# Patient Record
Sex: Male | Born: 1990 | Race: Black or African American | Hispanic: No | Marital: Single | Smoking: Current every day smoker
Health system: Southern US, Community
[De-identification: ages and names within clinical notes are randomized; demographics above are authoritative.]

## PROBLEM LIST (undated history)

## (undated) DIAGNOSIS — F319 Bipolar disorder, unspecified: Secondary | ICD-10-CM

## (undated) DIAGNOSIS — F909 Attention-deficit hyperactivity disorder, unspecified type: Secondary | ICD-10-CM

## (undated) HISTORY — PX: BACK SURGERY: SHX140

---
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
## 2010-09-27 ENCOUNTER — Emergency Department (HOSPITAL_COMMUNITY)
Admission: EM | Admit: 2010-09-27 | Discharge: 2010-09-27 | Disposition: A | Discharge status: Home / Self Care | Payer: Medicaid Other | Attending: Emergency Medicine | Admitting: Emergency Medicine

## 2010-09-27 ENCOUNTER — Emergency Department (HOSPITAL_COMMUNITY): Payer: Medicaid Other

## 2010-09-27 DIAGNOSIS — F988 Other specified behavioral and emotional disorders with onset usually occurring in childhood and adolescence: Secondary | ICD-10-CM | POA: Insufficient documentation

## 2010-09-27 DIAGNOSIS — J40 Bronchitis, not specified as acute or chronic: Secondary | ICD-10-CM | POA: Insufficient documentation

## 2011-04-08 ENCOUNTER — Emergency Department (INDEPENDENT_AMBULATORY_CARE_PROVIDER_SITE_OTHER)
Admission: EM | Admit: 2011-04-08 | Discharge: 2011-04-08 | Disposition: A | Discharge status: Home / Self Care | Payer: Medicaid Other | Source: Home / Self Care | Attending: Emergency Medicine | Admitting: Emergency Medicine

## 2011-04-08 ENCOUNTER — Encounter (HOSPITAL_COMMUNITY): Payer: Self-pay

## 2011-04-08 DIAGNOSIS — N342 Other urethritis: Secondary | ICD-10-CM

## 2011-04-08 LAB — HIV ANTIBODY (ROUTINE TESTING W REFLEX): HIV: NONREACTIVE

## 2011-04-08 MED ORDER — LIDOCAINE HCL (PF) 1 % IJ SOLN
INTRAMUSCULAR | Status: AC
  Filled 2011-04-08: qty 5

## 2011-04-08 MED ORDER — CEFTRIAXONE SODIUM 250 MG IJ SOLR
INTRAMUSCULAR | Status: AC
  Filled 2011-04-08: qty 250

## 2011-04-08 MED ORDER — CEFTRIAXONE SODIUM 250 MG IJ SOLR
250.0000 mg | Freq: Once | INTRAMUSCULAR | Status: AC
  Administered 2011-04-08: 250 mg via INTRAMUSCULAR

## 2011-04-08 MED ORDER — AZITHROMYCIN 250 MG PO TABS
ORAL_TABLET | ORAL | Status: AC
  Filled 2011-04-08: qty 4

## 2011-04-08 MED ORDER — AZITHROMYCIN 250 MG PO TABS
1000.0000 mg | ORAL_TABLET | Freq: Once | ORAL | Status: AC
  Administered 2011-04-08: 1000 mg via ORAL

## 2011-04-08 NOTE — ED Notes (Signed)
Patient complains of penile discharge x 3 days;  Patient also complains of burning with urination.

## 2011-04-08 NOTE — ED Provider Notes (Signed)
History     CSN: [id number] Arrival date & time: 04/08/2011  3:47 PM   First MD Initiated Contact with Patient 04/08/11 1609      Chief Complaint  Patient presents with  . Penile Discharge    (Consider location/radiation/quality/duration/timing/severity/associated sxs/prior treatment) HPI Comments: Fernando Mccormick has had a two-day history of a whitish urethral discharge and slight dysuria. There is tenderness at the end of his penis. He denies any fever, chills, skin rash, adenopathy, abdominal pain, nausea, or vomiting. He has had no genital lesions. He has 2 separate sexual partners. His last sexual intercourse was about 5 days ago. He denies any prior history of sexually transmitted diseases.  Patient is a 20 y.o. male presenting with penile discharge.  Penile Discharge Pertinent negatives include no abdominal pain.    History reviewed. No pertinent past medical history.  Past Surgical History  Procedure Date  . Orthopedic surgery 2008    left wrist fracture with internal fixation    History reviewed. No pertinent family history.  History  Substance Use Topics  . Smoking status: Current Everyday Smoker -- 0.2 packs/day  . Smokeless tobacco: Not on file  . Alcohol Use: No      Review of Systems  Constitutional: Negative for fever and chills.  Gastrointestinal: Negative for nausea, vomiting and abdominal pain.  Genitourinary: Positive for dysuria, discharge and penile pain. Negative for urgency, frequency, hematuria, flank pain, penile swelling, scrotal swelling, genital sores and testicular pain.    Allergies  Review of patient's allergies indicates not on file.  Home Medications  No current outpatient prescriptions on file.  BP 125/67  Pulse 82  Temp(Src) 98.3 F (36.8 C) (Oral)  Resp 16  SpO2 100%  Physical Exam  Nursing note and vitals reviewed. Constitutional: He appears well-developed and well-nourished. No distress.  Cardiovascular: Normal rate and regular  rhythm.  Exam reveals no gallop and no friction rub.   No murmur heard. Pulmonary/Chest: Effort normal. No respiratory distress. He has no wheezes. He has no rales.  Abdominal: Soft. Bowel sounds are normal. He exhibits no distension and no mass. There is no hepatosplenomegaly. There is no tenderness. There is no rebound, no guarding and no CVA tenderness.  Genitourinary: Penile tenderness present.       There is a whitish yellow urethral discharge and some pain to palpation at the end of the penis. There were no lesions on the penis. Testes were not tender and there were no masses. No inguinal lymphadenopathy. No ulcerations, blisters, or sores.  Skin: He is not diaphoretic.    ED Course  Procedures (including critical care time)   Labs Reviewed  RPR  GC/CHLAMYDIA PROBE AMP, GENITAL  HIV ANTIBODY (ROUTINE TESTING)   No results found.   1. 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 He was treated with Rocephin and azithromycin. Suggested he inform his partners so they can get tested and treated as well.        Roque Lias, MD 04/08/11 [phone]

## 2011-04-09 LAB — RPR: RPR Ser Ql: NONREACTIVE

## 2011-04-09 LAB — GC/CHLAMYDIA PROBE AMP, GENITAL
Chlamydia, DNA Probe: NEGATIVE
GC Probe Amp, Genital: POSITIVE — AB

## 2011-04-10 ENCOUNTER — Telehealth (HOSPITAL_COMMUNITY): Payer: Self-pay | Admitting: *Deleted

## 2011-04-10 NOTE — ED Notes (Addendum)
Labs and chart reviewed. Pt. pos. for GC and adequately treated with Rocephin and Zithromax.  DHHS form completed and faxed to [facility]. None of pt.'s numbers in service.  Will send a letter. Cherly Anderson M

## 2011-04-12 NOTE — ED Notes (Addendum)
Letter sent. Fernando Mccormick 04/12/2011

## 2012-09-06 ENCOUNTER — Encounter (HOSPITAL_COMMUNITY): Payer: Self-pay | Admitting: Emergency Medicine

## 2012-09-06 ENCOUNTER — Emergency Department (INDEPENDENT_AMBULATORY_CARE_PROVIDER_SITE_OTHER)
Admission: EM | Admit: 2012-09-06 | Discharge: 2012-09-06 | Disposition: A | Discharge status: Home / Self Care | Payer: Self-pay | Source: Home / Self Care | Attending: Emergency Medicine | Admitting: Emergency Medicine

## 2012-09-06 ENCOUNTER — Other Ambulatory Visit (HOSPITAL_COMMUNITY)
Admission: RE | Admit: 2012-09-06 | Discharge: 2012-09-06 | Disposition: A | Discharge status: Home / Self Care | Payer: Self-pay | Source: Ambulatory Visit | Attending: Emergency Medicine | Admitting: Emergency Medicine

## 2012-09-06 DIAGNOSIS — A64 Unspecified sexually transmitted disease: Secondary | ICD-10-CM

## 2012-09-06 DIAGNOSIS — Z113 Encounter for screening for infections with a predominantly sexual mode of transmission: Secondary | ICD-10-CM | POA: Insufficient documentation

## 2012-09-06 MED ORDER — AZITHROMYCIN 250 MG PO TABS
ORAL_TABLET | ORAL | Status: AC
  Filled 2012-09-06: qty 4

## 2012-09-06 MED ORDER — CEFTRIAXONE SODIUM 250 MG IJ SOLR
250.0000 mg | Freq: Once | INTRAMUSCULAR | Status: AC
  Administered 2012-09-06: 250 mg via INTRAMUSCULAR

## 2012-09-06 MED ORDER — AZITHROMYCIN 250 MG PO TABS
1000.0000 mg | ORAL_TABLET | Freq: Once | ORAL | Status: AC
  Administered 2012-09-06: 1000 mg via ORAL

## 2012-09-06 MED ORDER — CEFTRIAXONE SODIUM 1 G IJ SOLR
INTRAMUSCULAR | Status: AC
  Filled 2012-09-06: qty 10

## 2012-09-06 MED ORDER — LIDOCAINE HCL (PF) 1 % IJ SOLN
INTRAMUSCULAR | Status: AC
  Filled 2012-09-06: qty 5

## 2012-09-06 NOTE — ED Notes (Signed)
C/o discharge from penis with irritation and dysuria.   Symptoms present x 3 days. Denies pelvic/abdominal pain.

## 2012-09-06 NOTE — ED Notes (Signed)
Will discharge at 2:20

## 2012-09-06 NOTE — ED Provider Notes (Signed)
History     CSN: [id number]  Arrival date & time 09/06/12  1219   First MD Initiated Contact with Patient 09/06/12 1316      Chief Complaint  Patient presents with  . Exposure to STD    discharge from penis and irritation    (Consider location/radiation/quality/duration/timing/severity/associated sxs/prior treatment) HPI Comments: Patient presents urgent care describing for the last 3 days have been having a color discharge from his penis with irritation when he urinates. Patient admits to unsafe sexual practices haven't had a last unprotected encounter about a week ago. Patient denies any rashes, rectal pain or abdominal pain or fevers. Describes that he has been tested for HIV and syphilis in the past with negative results.  Patient is a 22 y.o. male presenting with STD exposure. The history is provided by the patient.  Exposure to STD This is a new problem. The problem occurs constantly. The problem has not changed since onset.Pertinent negatives include no abdominal pain. Nothing aggravates the symptoms.    History reviewed. No pertinent past medical history.  Past Surgical History  Procedure Laterality Date  . Orthopedic surgery  2008    left wrist fracture with internal fixation    History reviewed. No pertinent family history.  History  Substance Use Topics  . Smoking status: Current Every Day Smoker -- 0.25 packs/day  . Smokeless tobacco: Not on file  . Alcohol Use: No      Review of Systems  Constitutional: Negative for fever, activity change and appetite change.  Gastrointestinal: Negative for nausea, vomiting and abdominal pain.  Genitourinary: Positive for discharge and penile pain. Negative for urgency, frequency, flank pain, decreased urine volume and genital sores.  Skin: Negative for rash.    Allergies  Review of patient's allergies indicates no known allergies.  Home Medications  No current outpatient prescriptions on file.  BP 114/72  Pulse 59   Temp(Src) 98 F (36.7 C) (Oral)  Resp 16  SpO2 97%  Physical Exam  Constitutional: He appears well-developed and well-nourished.  Genitourinary: Penis normal. No penile tenderness.  Neurological: He is alert.  Skin: No rash noted. No erythema.    ED Course  Procedures (including critical care time)  Labs Reviewed  URINE CYTOLOGY ANCILLARY ONLY   No results found.   1. Sexually transmitted disease       MDM  Urine cytology study ordered for STD screening. Patient has been treated today with both sagittal and azithromycin. Have been instructed to followup at the STD clinic at [facility] 4-6 weeks from now. For further HIV and syphilis screening.        Jimmie Molly, MD 09/06/12 [phone]

## 2012-09-08 ENCOUNTER — Telehealth (HOSPITAL_COMMUNITY): Payer: Self-pay | Admitting: *Deleted

## 2012-09-08 NOTE — ED Notes (Signed)
GC neg., Chlamydia pos., Trich neg.  Pt. adequately treated with Zithromax.  I called and person that answered home number said the number was incorrect.  I verified that I had dialed correctly. I called mobile number and left a message to call. [name] 09/08/2012

## 2012-09-10 ENCOUNTER — Telehealth (HOSPITAL_COMMUNITY): Payer: Self-pay | Admitting: *Deleted

## 2012-09-10 NOTE — ED Notes (Signed)
I called pt. and pt.'s mother answered the phone. She said he is at home but does not have a phone.  She asked if there was anything wrong. I told her I could not tell her. She said she would give him the message to call me. [name] 09/10/2012

## 2012-09-11 ENCOUNTER — Telehealth (HOSPITAL_COMMUNITY): Payer: Self-pay | Admitting: *Deleted

## 2012-09-15 ENCOUNTER — Telehealth (HOSPITAL_COMMUNITY): Payer: Self-pay | Admitting: *Deleted

## 2012-09-15 NOTE — ED Notes (Signed)
4/18 Left message.  Call 3.  4/22  Confidential letter sent with results and instructions. [name] 09/15/2012

## 2013-01-28 ENCOUNTER — Emergency Department (HOSPITAL_COMMUNITY)
Admission: EM | Admit: 2013-01-28 | Discharge: 2013-01-28 | Disposition: A | Discharge status: Home / Self Care | Payer: Medicaid Other | Attending: Emergency Medicine | Admitting: Emergency Medicine

## 2013-01-28 ENCOUNTER — Encounter (HOSPITAL_COMMUNITY): Payer: Self-pay | Admitting: Emergency Medicine

## 2013-01-28 DIAGNOSIS — R059 Cough, unspecified: Secondary | ICD-10-CM | POA: Insufficient documentation

## 2013-01-28 DIAGNOSIS — F17200 Nicotine dependence, unspecified, uncomplicated: Secondary | ICD-10-CM | POA: Insufficient documentation

## 2013-01-28 DIAGNOSIS — R05 Cough: Secondary | ICD-10-CM | POA: Insufficient documentation

## 2013-01-28 DIAGNOSIS — R079 Chest pain, unspecified: Secondary | ICD-10-CM | POA: Insufficient documentation

## 2013-01-28 DIAGNOSIS — J069 Acute upper respiratory infection, unspecified: Secondary | ICD-10-CM | POA: Insufficient documentation

## 2013-01-28 DIAGNOSIS — J3489 Other specified disorders of nose and nasal sinuses: Secondary | ICD-10-CM | POA: Insufficient documentation

## 2013-01-28 DIAGNOSIS — Z8659 Personal history of other mental and behavioral disorders: Secondary | ICD-10-CM | POA: Insufficient documentation

## 2013-01-28 HISTORY — DX: Attention-deficit hyperactivity disorder, unspecified type: F90.9

## 2013-01-28 NOTE — ED Notes (Signed)
Patient complaining of sore throat and cough since last night.

## 2013-01-28 NOTE — ED Provider Notes (Signed)
CSN: [id number]     Arrival date & time 01/28/13  2319 History  This chart was scribed for Zadie Rhine, MD by Bennett Scrape, ED Scribe. This patient was seen in room APA18/APA18 and the patient's care was started at 11:30 PM.   Chief Complaint  Patient presents with  . Sore Throat  . Cough    Patient is a 22 y.o. male presenting with cough. The history is provided by the patient. No language interpreter was used.  Cough Cough characteristics:  Productive Sputum characteristics:  Green Onset quality:  Gradual Duration:  1 day Timing:  Constant Progression:  Worsening Chronicity:  New Smoker: yes   Associated symptoms: chest pain and sore throat   Associated symptoms: no fever and no shortness of breath     HPI Comments: Fernando Mccormick is a 22 y.o. male who presents to the Emergency Department complaining of cough intermittently productive of green sputum with associated sore throat, nasal congestion and chest pain that began last night. The CP described as soreness and sore throat are aggravated by coughing. He reports taking ibuprofen with no improvement. He denies having a h/o of asthma or any other pulmonary problems. He denies fevers, chills and nausea as associated symptoms.   Past Medical History  Diagnosis Date  . ADHD (attention deficit hyperactivity disorder)    Past Surgical History  Procedure Laterality Date  . Orthopedic surgery  2008    left wrist fracture with internal fixation   History reviewed. No pertinent family history. History  Substance Use Topics  . Smoking status: Current Every Day Smoker -- 0.25 packs/day  . Smokeless tobacco: Not on file  . Alcohol Use: Yes     Comment: occasionally    Review of Systems  Constitutional: Negative for fever.  HENT: Positive for congestion and sore throat. Negative for trouble swallowing.   Respiratory: Positive for cough. Negative for shortness of breath.   Cardiovascular: Positive for chest pain.  All other  systems reviewed and are negative.    Allergies  Shellfish allergy  Home Medications  No current outpatient prescriptions on file.  Triage Vitals: BP 116/75  Pulse 66  Temp(Src) 98.1 F (36.7 C) (Oral)  Resp 16  Ht 6' (1.829 m)  Wt 145 lb (65.772 kg)  BMI 19.66 kg/m2  SpO2 96%  Physical Exam  Nursing note and vitals reviewed.  CONSTITUTIONAL: Well developed/well nourished HEAD: Normocephalic/atraumatic EYES: EOMI/PERRL ENMT: Mucous membranes moist, nasal congestion NECK: supple no meningeal signs SPINE:entire spine nontender CV: S1/S2 noted, no murmurs/rubs/gallops noted LUNGS: Lungs are clear to auscultation bilaterally, no apparent distress ABDOMEN: soft, nontender, no rebound or guarding GU:no cva tenderness NEURO: Pt is awake/alert, moves all extremitiesx4 EXTREMITIES: pulses normal, full ROM SKIN: warm, color normal PSYCH: no abnormalities of mood noted  ED Course  Procedures (including critical care time)  DIAGNOSTIC STUDIES: Oxygen Saturation is 96% on room air, normal by my interpretation.    COORDINATION OF CARE: 11:38 PM-Informed pt that his symptoms are most likely viral and that no further work up is needed. Discussed discharge plan which includes ibuprofen for pain with pt and pt agreed to plan. Also advised pt to follow up as needed and pt agreed. Addressed symptoms to return for with pt.   Labs Review Labs Reviewed - No data to display Imaging Review No results found.  MDM  No diagnosis found. Nursing notes including past medical history and social history reviewed and considered in documentation   I personally performed the services  described in this documentation, which was scribed in my presence. The recorded information has been reviewed and is accurate.      Joya Gaskins, MD 01/29/13 [phone]

## 2014-08-13 ENCOUNTER — Emergency Department (HOSPITAL_COMMUNITY)
Admission: EM | Admit: 2014-08-13 | Discharge: 2014-08-13 | Disposition: A | Discharge status: Home / Self Care | Payer: Medicaid Other | Attending: Emergency Medicine | Admitting: Emergency Medicine

## 2014-08-13 ENCOUNTER — Encounter (HOSPITAL_COMMUNITY): Payer: Self-pay | Admitting: Emergency Medicine

## 2014-08-13 DIAGNOSIS — L089 Local infection of the skin and subcutaneous tissue, unspecified: Secondary | ICD-10-CM | POA: Insufficient documentation

## 2014-08-13 DIAGNOSIS — Y9289 Other specified places as the place of occurrence of the external cause: Secondary | ICD-10-CM | POA: Insufficient documentation

## 2014-08-13 DIAGNOSIS — Y998 Other external cause status: Secondary | ICD-10-CM | POA: Insufficient documentation

## 2014-08-13 DIAGNOSIS — S0086XA Insect bite (nonvenomous) of other part of head, initial encounter: Secondary | ICD-10-CM | POA: Insufficient documentation

## 2014-08-13 DIAGNOSIS — W57XXXA Bitten or stung by nonvenomous insect and other nonvenomous arthropods, initial encounter: Secondary | ICD-10-CM | POA: Insufficient documentation

## 2014-08-13 DIAGNOSIS — Y9389 Activity, other specified: Secondary | ICD-10-CM | POA: Insufficient documentation

## 2014-08-13 DIAGNOSIS — Z8659 Personal history of other mental and behavioral disorders: Secondary | ICD-10-CM | POA: Insufficient documentation

## 2014-08-13 MED ORDER — ACETAMINOPHEN 325 MG PO TABS
650.0000 mg | ORAL_TABLET | Freq: Once | ORAL | Status: AC
  Administered 2014-08-13: 650 mg via ORAL
  Filled 2014-08-13: qty 2

## 2014-08-13 MED ORDER — SULFAMETHOXAZOLE-TRIMETHOPRIM 800-160 MG PO TABS: 1.0000 | ORAL_TABLET | Freq: Two times a day (BID) | ORAL | Status: DC

## 2014-08-13 MED ORDER — ACETAMINOPHEN ER 650 MG PO TBCR: 650.0000 mg | EXTENDED_RELEASE_TABLET | Freq: Three times a day (TID) | ORAL | Status: DC | PRN

## 2014-08-13 MED ORDER — SULFAMETHOXAZOLE-TRIMETHOPRIM 800-160 MG PO TABS
1.0000 | ORAL_TABLET | Freq: Once | ORAL | Status: AC
  Administered 2014-08-13: 1 via ORAL
  Filled 2014-08-13: qty 1

## 2014-08-13 NOTE — ED Notes (Signed)
Pt transported from home with c/o spider bite to forehead x 1 day, A & O, no distress noted.

## 2014-08-13 NOTE — ED Provider Notes (Signed)
CSN: [id number]     Arrival date & time 08/13/14  2051 History   First MD Initiated Contact with Patient 08/13/14 2101     This chart was scribed for non-physician practitioner, Emilia Beck PA-C working with Richardean Canal, MD by Arlan Organ, ED Scribe. This patient was seen in room WTR9/WTR9 and the patient's care was started at 10:03 PM.   Chief Complaint  Patient presents with  . Insect Bite   The history is provided by the patient. No language interpreter was used.    HPI Comments: Fernando Mccormick is a 24 y.o. male with a PMHx of ADHD who presents to the Emergency Department complaining of a possible insect bite to the forehead x 1 day. Pt states he was bitten by a spider and has now noted swelling to  the forehead after "popping" the area. He has not tried any OTC medications or home remedies to help manage symptoms. No recent fever or chills.  No noticeable reactions to other parts of body. No known allergies to medications.  Past Medical History  Diagnosis Date  . ADHD (attention deficit hyperactivity disorder)    Past Surgical History  Procedure Laterality Date  . Orthopedic surgery  2008    left wrist fracture with internal fixation   No family history on file. History  Substance Use Topics  . Smoking status: Current Every Day Smoker -- 0.25 packs/day  . Smokeless tobacco: Not on file  . Alcohol Use: Yes     Comment: occasionally    Review of Systems  Constitutional: Negative for fever and chills.  Skin: Positive for wound.  All other systems reviewed and are negative.     Allergies  Shellfish allergy  Home Medications   Prior to Admission medications   Not on File   Triage Vitals: BP 129/64 mmHg  Pulse 87  Temp(Src) 98.4 F (36.9 C) (Oral)  Resp 16  SpO2 98%   Physical Exam  Constitutional: He is oriented to person, place, and time. He appears well-developed and well-nourished. No distress.  HENT:  Head: Normocephalic and atraumatic.  Eyes:  Conjunctivae and EOM are normal.  Neck: Normal range of motion.  Cardiovascular: Normal rate and regular rhythm.  Exam reveals no gallop and no friction rub.   No murmur heard. Pulmonary/Chest: Effort normal and breath sounds normal. He has no wheezes. He has no rales. He exhibits no tenderness.  Abdominal: Soft. He exhibits no distension. There is no tenderness.  Musculoskeletal: Normal range of motion.  Neurological: He is alert and oriented to person, place, and time.  Speech is goal-oriented. Moves limbs without ataxia.   Skin: Skin is warm and dry.  4cm linear area of erythema and edema of central forehead. No open wound.   Psychiatric: He has a normal mood and affect.  Nursing note and vitals reviewed.   ED Course  Procedures (including critical care time)  DIAGNOSTIC STUDIES: Oxygen Saturation is 98% on R, Normal by my interpretation.    COORDINATION OF CARE: 10:04 PM- Will send home with antibiotic. Discussed treatment plan with pt at bedside and pt agreed to plan.     Labs Review Labs Reviewed - No data to display  Imaging Review No results found.   EKG Interpretation None      MDM   Final diagnoses:  Skin infection    10:08 PM Patient appears to have a small linear area of cellulitis of the central forehead. Vitals stable and patient afebrile. Patient instructed to  return with worsening or concerning symptoms.   I personally performed the services described in this documentation, which was scribed in my presence. The recorded information has been reviewed and is accurate.   Emilia BeckKaitlyn Breslin Burklow, PA-C 08/13/14 2209  Richardean Canalavid H Yao, MD 08/13/14 [phone]

## 2014-08-13 NOTE — Discharge Instructions (Signed)
Take bactrim as needed until gone. Take tylenol as needed for pain. Return to the ED with worsening or concerning symptoms.

## 2014-10-28 ENCOUNTER — Emergency Department (HOSPITAL_COMMUNITY)
Admission: EM | Admit: 2014-10-28 | Discharge: 2014-10-28 | Disposition: A | Discharge status: Home / Self Care | Payer: Medicaid Other | Attending: Emergency Medicine | Admitting: Emergency Medicine

## 2014-10-28 ENCOUNTER — Encounter (HOSPITAL_COMMUNITY): Payer: Self-pay | Admitting: *Deleted

## 2014-10-28 DIAGNOSIS — Z792 Long term (current) use of antibiotics: Secondary | ICD-10-CM | POA: Insufficient documentation

## 2014-10-28 DIAGNOSIS — Z8659 Personal history of other mental and behavioral disorders: Secondary | ICD-10-CM | POA: Insufficient documentation

## 2014-10-28 DIAGNOSIS — A64 Unspecified sexually transmitted disease: Secondary | ICD-10-CM

## 2014-10-28 DIAGNOSIS — Z720 Tobacco use: Secondary | ICD-10-CM | POA: Insufficient documentation

## 2014-10-28 MED ORDER — LIDOCAINE HCL (PF) 1 % IJ SOLN
INTRAMUSCULAR | Status: AC
  Administered 2014-10-28: 5 mL
  Filled 2014-10-28: qty 5

## 2014-10-28 MED ORDER — AZITHROMYCIN 250 MG PO TABS
1000.0000 mg | ORAL_TABLET | Freq: Once | ORAL | Status: AC
  Administered 2014-10-28: 1000 mg via ORAL
  Filled 2014-10-28: qty 4

## 2014-10-28 MED ORDER — CEFTRIAXONE SODIUM 250 MG IJ SOLR
250.0000 mg | Freq: Once | INTRAMUSCULAR | Status: AC
  Administered 2014-10-28: 250 mg via INTRAMUSCULAR
  Filled 2014-10-28: qty 250

## 2014-10-28 NOTE — ED Notes (Signed)
The pt is c/o penis discharge for 2-3 days.  He has had sex with someone that has a std   He thinks.  He also foound a tick on his testicles that he wants looked at

## 2014-10-28 NOTE — ED Provider Notes (Signed)
CSN: [id number]     Arrival date & time 10/28/14  1508 History  This chart was scribed for non-physician practitioner, Catha GosselinHanna Patel-Mills, PA-C working with Toy CookeyMegan Docherty, MD by Doreatha MartinEva Mathews, ED scribe. This patient was seen in room TR06C/TR06C and the patient's care was started at 3:58 PM    Chief Complaint  Patient presents with  . Exposure to STD   The history is provided by the patient. No language interpreter was used.    HPI Comments: Fernando Mccormick is a 24 y.o. male who presents to the Emergency Department complaining of STD exposure due to unprotected sex 3 days ago. He is requesting STD check as well as, HIV and syphilis test. Pt states 6 sexual partners in the past 6 months. He reports white penile discharge onset 3 days ago, and "tickling" with urination as associated symptoms. He also notes one mild incident of hematuria. He states he has had Gonorrhea in the past. No OTC medications tried PTA.  He denies dysuria, abdominal pain, testicular pain, fever or chills.    Past Medical History  Diagnosis Date  . ADHD (attention deficit hyperactivity disorder)    Past Surgical History  Procedure Laterality Date  . Orthopedic surgery  2008    left wrist fracture with internal fixation   No family history on file. History  Substance Use Topics  . Smoking status: Current Every Day Smoker -- 0.25 packs/day  . Smokeless tobacco: Not on file  . Alcohol Use: Yes     Comment: occasionally    Review of Systems  Constitutional: Negative for fever and chills.  Gastrointestinal: Negative for abdominal pain.  Genitourinary: Positive for hematuria ("one drop of blood") and discharge. Negative for flank pain, penile swelling, scrotal swelling, difficulty urinating, genital sores, penile pain and testicular pain.  Skin: Negative for rash and wound.      Allergies  Shellfish allergy  Home Medications   Prior to Admission medications   Medication Sig Start Date End Date Taking? Authorizing  Provider  acetaminophen (TYLENOL 8 HOUR) 650 MG CR tablet Take 1 tablet (650 mg total) by mouth every 8 (eight) hours as needed for pain. 08/13/14   Kaitlyn Szekalski, PA-C  sulfamethoxazole-trimethoprim (SEPTRA DS) 800-160 MG per tablet Take 1 tablet by mouth every 12 (twelve) hours. 08/13/14   Emilia BeckKaitlyn Szekalski, PA-C   Triage VS: BP 125/73 mmHg  Pulse 84  Temp(Src) 98.3 F (36.8 C) (Oral)  Resp 20  Wt 154 lb 9.6 oz (70.126 kg)  SpO2 98% Physical Exam  Constitutional: He is oriented to person, place, and time. He appears well-developed and well-nourished. No distress.  HENT:  Head: Normocephalic and atraumatic.  Mouth/Throat: Oropharynx is clear and moist.  Eyes: Conjunctivae and EOM are normal. Pupils are equal, round, and reactive to light.  Neck: Normal range of motion. Neck supple. No tracheal deviation present.  Cardiovascular: Normal rate and normal heart sounds.  Exam reveals no gallop and no friction rub.   No murmur heard. Pulmonary/Chest: Breath sounds normal. No respiratory distress.  Abdominal: Soft. He exhibits no distension. There is no tenderness. There is no rebound and no guarding.  Musculoskeletal: Normal range of motion. He exhibits no edema or tenderness.  Neurological: He is alert and oriented to person, place, and time.  Skin: Skin is warm and dry.  Psychiatric: He has a normal mood and affect. His behavior is normal.  Nursing note and vitals reviewed.   ED Course  Procedures (including critical care time) DIAGNOSTIC STUDIES: Oxygen  Saturation is 98% on RA, normal by my interpretation.    COORDINATION OF CARE: 4:03 PM Discussed treatment plan with pt at bedside and pt agreed to plan.   Labs Review Labs Reviewed  RPR  HIV ANTIBODY (ROUTINE TESTING)    Imaging Review No results found.   EKG Interpretation None      MDM   Final diagnoses:  STD (male)   Patient presents for white discharge from the penis for the past 3 days with STD exposure.  He denies having a tick on his testicle at this time and states he removed it.  Medications  azithromycin (ZITHROMAX) tablet 1,000 mg (1,000 mg Oral Given 10/28/14 1613)  cefTRIAXone (ROCEPHIN) injection 250 mg (250 mg Intramuscular Given 10/28/14 1613)  lidocaine (PF) (XYLOCAINE) 1 % injection (5 mLs  Given 10/28/14 1613)  I discussed no sexual intercourse for 6 weeks and that the hospital would call him with the results of the HIV and syphilis results. He is to inform his sexual partners.   I personally performed the services described in this documentation, which was scribed in my presence. The recorded information has been reviewed and is accurate.   Catha Gosselin, PA-C 10/29/14 1204  Toy Cookey, MD 10/31/14 [phone]

## 2014-10-28 NOTE — Discharge Instructions (Signed)
Sexually Transmitted Disease °A sexually transmitted disease (STD) is a disease or infection often passed to another person during sex. However, STDs can be passed through nonsexual ways. An STD can be passed through: °· Spit (saliva). °· Semen. °· Blood. °· Mucus from the vagina. °· Pee (urine). °HOW CAN I LESSEN MY CHANCES OF GETTING AN STD? °· Use: °¨ Latex condoms. °¨ Water-soluble lubricants with condoms. Do not use petroleum jelly or oils. °¨ Dental dams. These are small pieces of latex that are used as a barrier during oral sex. °· Avoid having more than one sex partner. °· Do not have sex with someone who has other sex partners. °· Do not have sex with anyone you do not know or who is at high risk for an STD. °· Avoid risky sex that can break your skin. °· Do not have sex if you have open sores on your mouth or skin. °· Avoid drinking too much alcohol or taking illegal drugs. Alcohol and drugs can affect your good judgment. °· Avoid oral and anal sex acts. °· Get shots (vaccines) for HPV and hepatitis. °· If you are at risk of being infected with HIV, it is advised that you take a certain medicine daily to prevent HIV infection. This is called pre-exposure prophylaxis (PrEP). You may be at risk if: °¨ You are a man who has sex with other men (MSM). °¨ You are attracted to the opposite sex (heterosexual) and are having sex with more than one partner. °¨ You take drugs with a needle. °¨ You have sex with someone who has HIV. °· Talk with your doctor about if you are at high risk of being infected with HIV. If you begin to take PrEP, get tested for HIV first. Get tested every 3 months for as long as you are taking PrEP. °WHAT SHOULD I DO IF I THINK I HAVE AN STD? °· See your doctor. °· Tell your sex partner(s) that you have an STD. They should be tested and treated. °· Do not have sex until your doctor says it is okay. °WHEN SHOULD I GET HELP? °Get help right away if: °· You have bad belly (abdominal)  pain. °· You are a man and have puffiness (swelling) or pain in your testicles. °· You are a woman and have puffiness in your vagina. °Document Released: 06/20/2004 Document Revised: 05/18/2013 Document Reviewed: 11/06/2012 °ExitCare® Patient Information ©2015 [employer]. This information is not intended to replace advice given to you by your health care provider. Make sure you discuss any questions you have with your health care provider. ° ° °Emergency Department Resource Guide °1) Find a Doctor and Pay Out of Pocket °Although you won't have to find out who is covered by your insurance plan, it is a good idea to ask around and get recommendations. You will then need to call the office and see if the doctor you have chosen will accept you as a new patient and what types of options they offer for patients who are self-pay. Some doctors offer discounts or will set up payment plans for their patients who do not have insurance, but you will need to ask so you aren't surprised when you get to your appointment. ° °2) Contact Your Local Health Department °Not all health departments have doctors that can see patients for sick visits, but many do, so it is worth a call to see if yours does. If you don't know where your local health department is, you can   check in your phone book. The CDC also has a tool to help you locate your state's health department, and many state websites also have listings of all of their local health departments. ° °3) Find a Walk-in Clinic °If your illness is not likely to be very severe or complicated, you may want to try a walk in clinic. These are popping up all over the country in pharmacies, drugstores, and shopping centers. They're usually staffed by nurse practitioners or physician assistants that have been trained to treat common illnesses and complaints. They're usually fairly quick and inexpensive. However, if you have serious medical issues or chronic medical problems, these are  probably not your best option. ° °No Primary Care Doctor: °- Call [facility] at  832-8000 - they can help you locate a primary care doctor that  accepts your insurance, provides certain services, etc. °- Physician Referral Service- 1-800-533-3463 ° °Chronic Pain Problems: °Organization         Address  Phone   Notes  °[facility]  (336) 297-2271 Patients need to be referred by their primary care doctor.  ° °Medication Assistance: °Organization         Address  Phone   Notes  °[place] [facility] [address] °[place], [place] [address] (336) 641-8030 --Must be a resident of [place] °-- Must have NO insurance coverage whatsoever (no Medicaid/ Medicare, etc.) °-- The pt. MUST have a primary care doctor that directs their care regularly and follows them in the community °  °[facility]  (866) 331-1348   °[employer]  (888) 892-1162   ° °Agencies that provide inexpensive medical care: °Organization         Address  Phone   Notes  °[facility]  (336) 832-8035   °[facility]    (336) 832-7272   °[facility] [address] °[place], [place] [address] (336) 832-4777   °[facility] [address], °[place] (336) 271-4999   °[facility]    (336) 373-0678   °[facility]    (336) 272-1050   °[facility] ° [address], [place] Phone:  (336) 832-4444, Fax:  (336) 832-4440 Hours of Operation:  9 am - 6 pm, M-F.  Also accepts Medicaid/Medicare and self-pay.  °[facility] ° [address], [place] Phone: (336) 832-3150, Fax: (336) [phone]. Hours of Operation:  8:30 am - 5:30 pm, M-F.  Also accepts Medicaid and self-pay.  °[facility] [place] [address], [place] Phone: (336) 878-6027   °[facility] [address], [place], [place] (336)723-1848, Ext. 123 Mondays & Thursdays:  7-9 AM.  First 15 patients are seen on a first come, first serve basis. °  ° °Medicaid-accepting [place] Providers: ° °Organization         Address  Phone   Notes  °[facility] [address], [place] (336) 641-2100 Also accepts self-pay patients.  °[facility] [address], [place] ° (336) 856-9996   °[facility] [address], [place] (336) 288-8857   °[facility] [address] [place] [address], [place] (336) 299-7000   °Veita Bland [address], [place]  ° (336) 373-1557 Only accepts [place] Access Medicaid patients after they have their name applied to   their card.  ° °Self-Pay (no insurance) in [place]: ° °Organization         Address  Phone   Notes  °Sickle Cell Patients, [facility] [address], [place] (336) 832-1970   °[facility] [address], [place] (336) 832-4400   °[facility] ° [address], [place] (336) 992-4800   °[facility]/Dr. Osei-Bonsu ° [address] [place] [address], [place] or [address], [place] (336) 841-8500 Phone number for both [place] and [place] locations is the same.  °[facility] [address], [place] (336) 299-0000   °[facility] [address] [place] [address], [place] or [address] (336) 852-7530 °(336) 878-2260   °[facility] [address], [place] (336) 350-1642, phone; (336) 294-5005, fax Sees patients 1st and 3rd Saturday of every month.  Must not qualify for public or private insurance (i.e. Medicaid, Medicare, [place] Health Choice, Veterans' Benefits) • Household income should be no more than 200% of the poverty level •The clinic cannot treat you if you are pregnant or think you are pregnant • Sexually transmitted diseases are not treated at the clinic.   ° ° °Dental Care: °Organization         Address  Phone  Notes  °[facility] [address], [place] (336) 641-6152 Accepts children up to age 21 who are enrolled in Medicaid or [place] Health Choice; pregnant women with a Medicaid card; and children who have applied for Medicaid or [place] Health Choice, but were declined, whose parents can pay a reduced fee at time of service.  °[facility] [place]  [address], [place] (336) 641-7733 Accepts children up to age 21 who are enrolled in Medicaid or [place] Health Choice; pregnant women with a Medicaid card; and children who have applied for Medicaid or [place] Health Choice, but were declined, whose parents can pay a reduced fee at time of service.  °[facility] ° [address], [place] (336) 641-4533 Patients are seen by appointment only. Walk-ins are not accepted. [facility] will see patients 18 years of age and older. °Monday - Tuesday (8am-5pm) °Most Wednesdays (8:30-5pm) °$30 per visit, cash only  °[facility] ° [address], [place] (336) 641-4533 Patients are seen by appointment only. Walk-ins are not accepted. [facility] will see patients 18 years of age and older. °One Wednesday Evening (Monthly: Volunteer Based).  $30 per visit, cash only  °[facility]  (919) 537-3737 for adults; Children under age 4, call Graduate Pediatric Dentistry at ([phone]) [phone]. Children aged 4-14, please call (919) [phone] to request a pediatric application. ° Dental services are provided in all areas of dental care including fillings, crowns and bridges, complete and partial dentures, implants, gum treatment, root canals, and extractions. Preventive care is also provided. Treatment is provided to both adults and children. °Patients are selected via a lottery and there is often a waiting  list. °  °[facility] [address], ° ° (336) 763-8833 [email or web address] °  °[facility] [address], [place], [place] (336)723-1848, Ext. 123 Second and Fourth Thursday of each month, opens at 6:30 AM; Clinic ends at 9 AM.  Patients are seen on a first-come first-served basis,   and a limited number are seen during each clinic.  ° °[facility] ° [address], [place], [place] (336) 723-7904   Eligibility Requirements °You must have lived in [place], [place], or [place] counties for at least the last three months. °  You cannot be eligible for state or federal sponsored healthcare insurance, including Veterans Administration, Medicaid, or Medicare. °  You generally cannot be eligible for healthcare insurance through your employer.  °  How to apply: °Eligibility screenings are held every Tuesday and Wednesday afternoon from 1:00 pm until 4:00 pm. You do not need an appointment for the interview!  °[facility] [address], [place], [place] 336-631-2330   °[facility]  336-342-8273   °[facility]  336-703-3100   °[facility]  336-570-6415   ° °Behavioral Health Resources in the Community: °Intensive Outpatient Programs °Organization         Address  Phone  Notes  °[facility] [address], [place], [place] 336-878-6098   °[facility] [address], [place], [place] 336-832-9800   °[facility] [address], [place], [place] ° 336-882-2125   °[facility] [address],  °[place], [place] 1-800-853-5163 or 336-641-4981   °Substance Abuse Resources °Organization         Address  Phone  Notes  °[facility]  336-882-2125   °[facility]  336-784-9470   °[facility]  336-285-9073   °[facility]  336-845-3988   °Residential & Outpatient Substance Abuse Program   1-800-659-3381   °Psychological Services °Organization         Address  Phone  Notes  °[facility]  336- 832-9600   °[facility]  336- 378-7881   °[facility] [address], [place] 1-800-853-5163 or 336-641-4981   ° °Mobile Crisis Teams °Organization         Address  Phone  Notes  °[facility], Mobile Crisis Care Unit  1-877-626-1772   °Assertive °[facility] ° [address] [place], [place] 336-834-9664   °Sharon DeEsch [address] °[place] 336-554-5454   ° °Self-Help/Support Groups °Organization         Address  Phone             Notes  °[facility] [place] - variety of support groups  336- 373-1402 Call for more information  °Narcotics Anonymous (NA), [facility] [address], °[place]  2 meetings at this location  ° °Residential Treatment Programs °Organization         Address  Phone  Notes  °[facility] [address],    °[place]  1-866-801-8205   °[facility] ° [address], [place], [place] 704-293-8524   °[facility] [address], [place] 336-845-3988 Admissions: 8am-3pm M-F  °[facility] [address]    °[place], [place] 336-841-1104   °[facility] [address], [place], [place] 336-379-7146   °[facility] [address]  °[place], [place] 336-285-9073   °[facility] Intensive Outpatient [address], [place], [place] 336-852-3033   °[facility] [address]  °[place], [place] 1-877-615-2722 or 336-784-9470   °[facility] [address] [place], [place] 336-227-7417 Accepts Medicaid  °[facility] [address]  °[place] 1-800-659-3381 Substance Abuse/Addiction Treatment  ° °[place] Behavioral Health Resources °Organization          Address  Phone  Notes  °[facility]  (888) 581-9988   °Julie Brannon, PhD [address] [place], [place]   (336) 349-5553 or (336) 951-0000   °[facility]   [address] °[place], [place] (336) 349-4454   °[facility] [address], [place], [place] (336) 342-8316 Insurance/Medicaid/sponsorship through [facility]  °[facility] [address]                                    [place], [place] (336) 342-8316 Therapy/tele-psych/case  °[facility] [address]  ° [place], [place] (336) 349-2233    °Dr. Arfeen  (336) 349-4544   °[facility] [place]  [employer] [place] [facility] 1) [address], [place] °2) [address], [place] °3)  [address], [place] (336) 349-3220 °(336) 342-7768 ° °(336) 342-8140   °[place] [facility] (336) 342-1394 or (336) 342-3537 (After Hours)    ° ° ° °

## 2014-10-29 LAB — RPR: RPR Ser Ql: NONREACTIVE

## 2014-10-29 LAB — HIV ANTIBODY (ROUTINE TESTING W REFLEX): HIV Screen 4th Generation wRfx: NONREACTIVE

## 2014-10-31 ENCOUNTER — Encounter (HOSPITAL_COMMUNITY): Payer: Self-pay | Admitting: *Deleted

## 2014-10-31 ENCOUNTER — Emergency Department (HOSPITAL_COMMUNITY)
Admission: EM | Admit: 2014-10-31 | Discharge: 2014-10-31 | Disposition: A | Discharge status: Home / Self Care | Payer: Medicaid Other | Attending: Emergency Medicine | Admitting: Emergency Medicine

## 2014-10-31 DIAGNOSIS — R197 Diarrhea, unspecified: Secondary | ICD-10-CM | POA: Insufficient documentation

## 2014-10-31 DIAGNOSIS — M791 Myalgia: Secondary | ICD-10-CM | POA: Insufficient documentation

## 2014-10-31 DIAGNOSIS — Z8659 Personal history of other mental and behavioral disorders: Secondary | ICD-10-CM | POA: Insufficient documentation

## 2014-10-31 DIAGNOSIS — H578 Other specified disorders of eye and adnexa: Secondary | ICD-10-CM | POA: Insufficient documentation

## 2014-10-31 DIAGNOSIS — J029 Acute pharyngitis, unspecified: Secondary | ICD-10-CM | POA: Insufficient documentation

## 2014-10-31 DIAGNOSIS — Y999 Unspecified external cause status: Secondary | ICD-10-CM | POA: Insufficient documentation

## 2014-10-31 DIAGNOSIS — R509 Fever, unspecified: Secondary | ICD-10-CM | POA: Insufficient documentation

## 2014-10-31 DIAGNOSIS — R51 Headache: Secondary | ICD-10-CM | POA: Insufficient documentation

## 2014-10-31 DIAGNOSIS — S70261A Insect bite (nonvenomous), right hip, initial encounter: Secondary | ICD-10-CM | POA: Insufficient documentation

## 2014-10-31 DIAGNOSIS — R21 Rash and other nonspecific skin eruption: Secondary | ICD-10-CM | POA: Insufficient documentation

## 2014-10-31 DIAGNOSIS — R05 Cough: Secondary | ICD-10-CM | POA: Insufficient documentation

## 2014-10-31 DIAGNOSIS — Y939 Activity, unspecified: Secondary | ICD-10-CM | POA: Insufficient documentation

## 2014-10-31 DIAGNOSIS — R0981 Nasal congestion: Secondary | ICD-10-CM | POA: Insufficient documentation

## 2014-10-31 DIAGNOSIS — R110 Nausea: Secondary | ICD-10-CM | POA: Insufficient documentation

## 2014-10-31 DIAGNOSIS — Z720 Tobacco use: Secondary | ICD-10-CM | POA: Insufficient documentation

## 2014-10-31 DIAGNOSIS — Y929 Unspecified place or not applicable: Secondary | ICD-10-CM | POA: Insufficient documentation

## 2014-10-31 DIAGNOSIS — W57XXXA Bitten or stung by nonvenomous insect and other nonvenomous arthropods, initial encounter: Secondary | ICD-10-CM | POA: Insufficient documentation

## 2014-10-31 LAB — BASIC METABOLIC PANEL
Anion gap: 6 (ref 5–15)
BUN: 14 mg/dL (ref 6–20)
CO2: 30 mmol/L (ref 22–32)
Calcium: 8.8 mg/dL — ABNORMAL LOW (ref 8.9–10.3)
Chloride: 101 mmol/L (ref 101–111)
Creatinine, Ser: 1.13 mg/dL (ref 0.61–1.24)
GFR calc Af Amer: >60 mL/min (ref >60)
GFR calc non Af Amer: >60 mL/min (ref >60)
Glucose, Bld: 98 mg/dL (ref 65–99)
Potassium: 3.6 mmol/L (ref 3.5–5.1)
Sodium: 137 mmol/L (ref 135–145)

## 2014-10-31 LAB — CBC WITH DIFFERENTIAL/PLATELET
Basophils Absolute: 0 10*3/uL (ref 0.0–0.1)
Basophils Relative: 1 % (ref 0–1)
Eosinophils Absolute: 0.1 10*3/uL (ref 0.0–0.7)
Eosinophils Relative: 2 % (ref 0–5)
HCT: 41.5 % (ref 39.0–52.0)
Hemoglobin: 14.1 g/dL (ref 13.0–17.0)
Lymphocytes Relative: 24 % (ref 12–46)
Lymphs Abs: 1.4 10*3/uL (ref 0.7–4.0)
MCH: 27.6 pg (ref 26.0–34.0)
MCHC: 34 g/dL (ref 30.0–36.0)
MCV: 81.2 fL (ref 78.0–100.0)
Monocytes Absolute: 0.8 10*3/uL (ref 0.1–1.0)
Monocytes Relative: 15 % — ABNORMAL HIGH (ref 3–12)
Neutro Abs: 3.4 10*3/uL (ref 1.7–7.7)
Neutrophils Relative %: 58 % (ref 43–77)
Platelets: 181 10*3/uL (ref 150–400)
RBC: 5.11 MIL/uL (ref 4.22–5.81)
RDW: 13.1 % (ref 11.5–15.5)
WBC: 5.7 10*3/uL (ref 4.0–10.5)

## 2014-10-31 MED ORDER — DOXYCYCLINE HYCLATE 100 MG PO CAPS: 100.0000 mg | ORAL_CAPSULE | Freq: Two times a day (BID) | ORAL | Status: DC

## 2014-10-31 MED ORDER — ONDANSETRON HCL 4 MG PO TABS: 4.0000 mg | ORAL_TABLET | Freq: Four times a day (QID) | ORAL | Status: DC

## 2014-10-31 NOTE — Discharge Instructions (Signed)
Take tylenol and ibuprofen as needed for aching and fever, return as needed for worsening symptoms

## 2014-10-31 NOTE — ED Notes (Signed)
Declined W/C at D/C and was escorted to lobby by RN. 

## 2014-10-31 NOTE — ED Provider Notes (Signed)
CSN: [id number]     Arrival date & time 10/31/14  1207 History  This chart was scribed for Kerrie Buffalo, NP working with Linwood Dibbles, MD by Evon Slack, ED Scribe. This patient was seen in room TR03C/TR03C and the patient's care was started at 1:01 PM.      Chief Complaint  Patient presents with  . Tick Removal    Patient is a 24 y.o. male presenting with animal bite. The history is provided by the patient.  Animal Bite Contact animal:  Insect Animal bite location: right buttock. Time since incident:  7 days Pain details:    Quality:  Itching   Severity:  Mild   Timing:  Constant Notifications:  None Animal in possession: no   Relieved by:  Nothing Ineffective treatments:  OTC medications Associated symptoms: fever and rash    HPI Comments: Fernando Mccormick is a 24 y.o. male who presents to the Emergency Department complaining of tick bite to his right buttock onset 6-7 days prior. Pt states that he started having headache, fever, congestion, body aches, diarrhea and occasional nausea 2 days ago. He also has eye irritation.  Pt states that he has also noticed small rash on his left arm. Pt states that he has tried ibuprofen and OTC medication with no relief. Pt states that he is having trouble sleeping due the chest congestion. Denies ear pain, vomiting or other related symptoms.    Past Medical History  Diagnosis Date  . ADHD (attention deficit hyperactivity disorder)    Past Surgical History  Procedure Laterality Date  . Orthopedic surgery  2008    left wrist fracture with internal fixation   History reviewed. No pertinent family history. History  Substance Use Topics  . Smoking status: Current Every Day Smoker -- 0.25 packs/day  . Smokeless tobacco: Not on file  . Alcohol Use: Yes     Comment: occasionally    Review of Systems  Constitutional: Positive for fever.  HENT: Positive for congestion and sore throat.   Eyes: Positive for redness.  Respiratory: Positive for  cough.   Gastrointestinal: Positive for nausea and diarrhea. Negative for vomiting.  Musculoskeletal: Positive for myalgias.  Skin: Positive for rash.  Neurological: Positive for headaches.  All other systems reviewed and are negative.     Allergies  Shellfish allergy  Home Medications   Prior to Admission medications   Medication Sig Start Date End Date Taking? Authorizing Provider  ibuprofen (ADVIL,MOTRIN) 200 MG tablet Take 600 mg by mouth every 6 (six) hours as needed for headache or mild pain.   Yes Historical Provider, MD  acetaminophen (TYLENOL 8 HOUR) 650 MG CR tablet Take 1 tablet (650 mg total) by mouth every 8 (eight) hours as needed for pain. Patient not taking: Reported on 10/31/2014 08/13/14   Emilia Beck, PA-C  doxycycline (VIBRAMYCIN) 100 MG capsule Take 1 capsule (100 mg total) by mouth 2 (two) times daily. 10/31/14   Hope Orlene Och, NP  ondansetron (ZOFRAN) 4 MG tablet Take 1 tablet (4 mg total) by mouth every 6 (six) hours. 10/31/14   Hope Orlene Och, NP  sulfamethoxazole-trimethoprim (SEPTRA DS) 800-160 MG per tablet Take 1 tablet by mouth every 12 (twelve) hours. Patient not taking: Reported on 10/31/2014 08/13/14   Kaitlyn Szekalski, PA-C   BP 120/65 mmHg  Pulse 71  Temp(Src) 98 F (36.7 C) (Oral)  Resp 16  Ht 6' (1.829 m)  Wt 155 lb (70.308 kg)  BMI 21.02 kg/m2  SpO2 100%  Physical Exam  Constitutional: He is oriented to person, place, and time. He appears well-developed and well-nourished. No distress.  HENT:  Head: Normocephalic and atraumatic.  Mouth/Throat: Oropharynx is clear and moist.  Eyes: Conjunctivae and EOM are normal. Pupils are equal, round, and reactive to light.  Neck: Neck supple. No tracheal deviation present.  Cardiovascular: Normal rate, regular rhythm and normal heart sounds.   Pulmonary/Chest: Effort normal and breath sounds normal. No respiratory distress. He has no wheezes. He has no rales.  Abdominal: Soft. There is no tenderness.   Genitourinary:  Raised firm area about .5 cm to the right buttocks near the anus consistent with insect bite, no erythema.   Musculoskeletal: Normal range of motion.  Neurological: He is alert and oriented to person, place, and time.  Skin: Skin is warm and dry. Rash noted.  Red raised area to the palmar aspect of the left forearm near the elbow.   Psychiatric: He has a normal mood and affect. His behavior is normal.  Nursing note and vitals reviewed.   ED Course  Procedures (including critical care time) DIAGNOSTIC STUDIES: Oxygen Saturation is 99% on RA, normal by my interpretation.    COORDINATION OF CARE: 1:11 PM-Discussed treatment plan with pt at bedside and pt agreed to plan.   LABS: Results for orders placed or performed during the hospital encounter of 10/31/14 (from the past 24 hour(s))  CBC with Differential/Platelet     Status: Abnormal   Collection Time: 10/31/14  1:48 PM  Result Value Ref Range   WBC 5.7 4.0 - 10.5 K/uL   RBC 5.11 4.22 - 5.81 MIL/uL   Hemoglobin 14.1 13.0 - 17.0 g/dL   HCT 62.141.5 30.839.0 - 65.752.0 %   MCV 81.2 78.0 - 100.0 fL   MCH 27.6 26.0 - 34.0 pg   MCHC 34.0 30.0 - 36.0 g/dL   RDW 84.613.1 96.211.5 - 95.215.5 %   Platelets 181 150 - 400 K/uL   Neutrophils Relative % 58 43 - 77 %   Neutro Abs 3.4 1.7 - 7.7 K/uL   Lymphocytes Relative 24 12 - 46 %   Lymphs Abs 1.4 0.7 - 4.0 K/uL   Monocytes Relative 15 (H) 3 - 12 %   Monocytes Absolute 0.8 0.1 - 1.0 K/uL   Eosinophils Relative 2 0 - 5 %   Eosinophils Absolute 0.1 0.0 - 0.7 K/uL   Basophils Relative 1 0 - 1 %   Basophils Absolute 0.0 0.0 - 0.1 K/uL  Basic metabolic panel     Status: Abnormal   Collection Time: 10/31/14  1:48 PM  Result Value Ref Range   Sodium 137 135 - 145 mmol/L   Potassium 3.6 3.5 - 5.1 mmol/L   Chloride 101 101 - 111 mmol/L   CO2 30 22 - 32 mmol/L   Glucose, Bld 98 65 - 99 mg/dL   BUN 14 6 - 20 mg/dL   Creatinine, Ser 8.411.13 0.61 - 1.24 mg/dL   Calcium 8.8 (L) 8.9 - 10.3 mg/dL    GFR calc non Af Amer >60 >60 mL/min   GFR calc Af Amer >60 >60 mL/min   Anion gap 6 5 - 15   Imaging Review No results found.   MDM  24 y.o. male with hx of tick bite 7 days ago and headache, fever, myalgias for the past 2 days will treat with Doxycycline and he will return for worsening symptoms. Stable for d/c without fever at this time and does not appear toxic.  Discussed with the patient and all questioned fully answered. He will call me if any problems arise.  Final diagnoses:  Tick bite    I personally performed the services described in this documentation, which was scribed in my presence. The recorded information has been reviewed and is accurate.      [address] [place], [place] 10/31/14 1537  Linwood Dibbles, MD 10/31/14 [phone]

## 2014-10-31 NOTE — ED Notes (Signed)
Pt reports a tick bite 7 days ago and pt reports fevers and feeling funny . Pt also reports bump at bite site.

## 2014-11-01 LAB — ROCKY MTN SPOTTED FVR ABS PNL(IGG+IGM)
RMSF IgG: NEGATIVE
RMSF IgM: 0.21 index (ref 0.00–0.89)

## 2015-03-01 ENCOUNTER — Encounter (HOSPITAL_COMMUNITY): Payer: Self-pay | Admitting: Emergency Medicine

## 2015-03-01 ENCOUNTER — Emergency Department (HOSPITAL_COMMUNITY)
Admission: EM | Admit: 2015-03-01 | Discharge: 2015-03-01 | Disposition: A | Discharge status: Home / Self Care | Payer: Medicaid Other | Attending: Emergency Medicine | Admitting: Emergency Medicine

## 2015-03-01 ENCOUNTER — Emergency Department (HOSPITAL_COMMUNITY): Payer: Medicaid Other

## 2015-03-01 DIAGNOSIS — S21212A Laceration without foreign body of left back wall of thorax without penetration into thoracic cavity, initial encounter: Secondary | ICD-10-CM

## 2015-03-01 DIAGNOSIS — Y998 Other external cause status: Secondary | ICD-10-CM | POA: Insufficient documentation

## 2015-03-01 DIAGNOSIS — S31010A Laceration without foreign body of lower back and pelvis without penetration into retroperitoneum, initial encounter: Secondary | ICD-10-CM | POA: Insufficient documentation

## 2015-03-01 DIAGNOSIS — Y9289 Other specified places as the place of occurrence of the external cause: Secondary | ICD-10-CM | POA: Insufficient documentation

## 2015-03-01 DIAGNOSIS — Y9389 Activity, other specified: Secondary | ICD-10-CM | POA: Insufficient documentation

## 2015-03-01 DIAGNOSIS — Z8659 Personal history of other mental and behavioral disorders: Secondary | ICD-10-CM | POA: Insufficient documentation

## 2015-03-01 DIAGNOSIS — Z720 Tobacco use: Secondary | ICD-10-CM | POA: Insufficient documentation

## 2015-03-01 LAB — COMPREHENSIVE METABOLIC PANEL
ALT: 18 U/L (ref 17–63)
AST: 21 U/L (ref 15–41)
Albumin: 4.4 g/dL (ref 3.5–5.0)
Alkaline Phosphatase: 48 U/L (ref 38–126)
Anion gap: 7 (ref 5–15)
BUN: 16 mg/dL (ref 6–20)
CHLORIDE: 102 mmol/L (ref 101–111)
CO2: 28 mmol/L (ref 22–32)
Calcium: 8.8 mg/dL — ABNORMAL LOW (ref 8.9–10.3)
Creatinine, Ser: 1 mg/dL (ref 0.61–1.24)
GFR calc non Af Amer: >60 mL/min (ref >60)
Glucose, Bld: 91 mg/dL (ref 65–99)
POTASSIUM: 3.9 mmol/L (ref 3.5–5.1)
SODIUM: 137 mmol/L (ref 135–145)
Total Bilirubin: 0.9 mg/dL (ref 0.3–1.2)
Total Protein: 7.3 g/dL (ref 6.5–8.1)

## 2015-03-01 LAB — CBC WITH DIFFERENTIAL/PLATELET
Basophils Absolute: 0 10*3/uL (ref 0.0–0.1)
Basophils Relative: 1 %
Eosinophils Absolute: 0 10*3/uL (ref 0.0–0.7)
Eosinophils Relative: 1 %
HEMATOCRIT: 43.6 % (ref 39.0–52.0)
HEMOGLOBIN: 14.9 g/dL (ref 13.0–17.0)
LYMPHS ABS: 1.9 10*3/uL (ref 0.7–4.0)
LYMPHS PCT: 33 %
MCH: 28.2 pg (ref 26.0–34.0)
MCHC: 34.2 g/dL (ref 30.0–36.0)
MCV: 82.4 fL (ref 78.0–100.0)
Monocytes Absolute: 0.4 10*3/uL (ref 0.1–1.0)
Monocytes Relative: 7 %
NEUTROS ABS: 3.4 10*3/uL (ref 1.7–7.7)
NEUTROS PCT: 58 %
Platelets: 221 10*3/uL (ref 150–400)
RBC: 5.29 MIL/uL (ref 4.22–5.81)
RDW: 12.6 % (ref 11.5–15.5)
WBC: 5.8 10*3/uL (ref 4.0–10.5)

## 2015-03-01 LAB — URINALYSIS, ROUTINE W REFLEX MICROSCOPIC
Bilirubin Urine: NEGATIVE
GLUCOSE, UA: NEGATIVE mg/dL
Hgb urine dipstick: NEGATIVE
Ketones, ur: NEGATIVE mg/dL
LEUKOCYTES UA: NEGATIVE
Nitrite: NEGATIVE
PH: 6 (ref 5.0–8.0)
PROTEIN: NEGATIVE mg/dL
SPECIFIC GRAVITY, URINE: 1.015 (ref 1.005–1.030)
Urobilinogen, UA: 0.2 mg/dL (ref 0.0–1.0)

## 2015-03-01 MED ORDER — SODIUM CHLORIDE 0.9 % IV BOLUS (SEPSIS)
1000.0000 mL | Freq: Once | INTRAVENOUS | Status: AC
  Administered 2015-03-01: 1000 mL via INTRAVENOUS

## 2015-03-01 MED ORDER — MORPHINE SULFATE (PF) 4 MG/ML IV SOLN
4.0000 mg | Freq: Once | INTRAVENOUS | Status: AC
  Administered 2015-03-01: 4 mg via INTRAVENOUS
  Filled 2015-03-01: qty 1

## 2015-03-01 MED ORDER — OXYCODONE-ACETAMINOPHEN 5-325 MG PO TABS: 1.0000 | ORAL_TABLET | ORAL | Status: DC | PRN

## 2015-03-01 MED ORDER — LIDOCAINE-EPINEPHRINE (PF) 2 %-1:200000 IJ SOLN
10.0000 mL | Freq: Once | INTRAMUSCULAR | Status: AC
  Administered 2015-03-01: 10 mL via INTRADERMAL
  Filled 2015-03-01: qty 20

## 2015-03-01 MED ORDER — IOHEXOL 300 MG/ML  SOLN
100.0000 mL | Freq: Once | INTRAMUSCULAR | Status: AC | PRN
  Administered 2015-03-01: 100 mL via INTRAVENOUS

## 2015-03-01 MED ORDER — ONDANSETRON HCL 4 MG/2ML IJ SOLN
4.0000 mg | Freq: Once | INTRAMUSCULAR | Status: AC
  Administered 2015-03-01: 4 mg via INTRAVENOUS
  Filled 2015-03-01: qty 2

## 2015-03-01 MED ORDER — BACITRACIN-NEOMYCIN-POLYMYXIN 400-5-5000 EX OINT
TOPICAL_OINTMENT | CUTANEOUS | Status: AC
  Administered 2015-03-01: 22:00:00
  Filled 2015-03-01: qty 2

## 2015-03-01 NOTE — Discharge Instructions (Signed)
You can shower. There may be a small amount of bleeding at the wound. Just dab it dry with a clean wash rag and apply antibiotic ointment and Band-Aid. Suture out next Friday. Return for any signs of infection. Medication for pain.
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
## 2015-03-09 ENCOUNTER — Emergency Department (HOSPITAL_COMMUNITY)
Admission: EM | Admit: 2015-03-09 | Discharge: 2015-03-09 | Disposition: A | Discharge status: Home / Self Care | Payer: Medicaid Other | Attending: Emergency Medicine | Admitting: Emergency Medicine

## 2015-03-09 ENCOUNTER — Encounter (HOSPITAL_COMMUNITY): Payer: Self-pay | Admitting: Emergency Medicine

## 2015-03-09 ENCOUNTER — Emergency Department (HOSPITAL_COMMUNITY): Payer: Medicaid Other

## 2015-03-09 DIAGNOSIS — Z720 Tobacco use: Secondary | ICD-10-CM | POA: Insufficient documentation

## 2015-03-09 DIAGNOSIS — S31030D Puncture wound without foreign body of lower back and pelvis without penetration into retroperitoneum, subsequent encounter: Secondary | ICD-10-CM | POA: Insufficient documentation

## 2015-03-09 DIAGNOSIS — L089 Local infection of the skin and subcutaneous tissue, unspecified: Secondary | ICD-10-CM

## 2015-03-09 DIAGNOSIS — Z4802 Encounter for removal of sutures: Secondary | ICD-10-CM | POA: Insufficient documentation

## 2015-03-09 DIAGNOSIS — T148XXA Other injury of unspecified body region, initial encounter: Secondary | ICD-10-CM

## 2015-03-09 DIAGNOSIS — Y793 Surgical instruments, materials and orthopedic devices (including sutures) associated with adverse incidents: Secondary | ICD-10-CM | POA: Insufficient documentation

## 2015-03-09 DIAGNOSIS — T814XXA Infection following a procedure, initial encounter: Secondary | ICD-10-CM | POA: Insufficient documentation

## 2015-03-09 DIAGNOSIS — Z8659 Personal history of other mental and behavioral disorders: Secondary | ICD-10-CM | POA: Insufficient documentation

## 2015-03-09 MED ORDER — CEPHALEXIN 500 MG PO CAPS: 1000.0000 mg | ORAL_CAPSULE | Freq: Two times a day (BID) | ORAL | Status: DC

## 2015-03-09 MED ORDER — SULFAMETHOXAZOLE-TRIMETHOPRIM 800-160 MG PO TABS: 1.0000 | ORAL_TABLET | Freq: Two times a day (BID) | ORAL | Status: AC

## 2015-03-09 MED ORDER — TRAMADOL HCL 50 MG PO TABS: 50.0000 mg | ORAL_TABLET | Freq: Four times a day (QID) | ORAL | Status: DC | PRN

## 2015-03-09 MED ORDER — LIDOCAINE-EPINEPHRINE (PF) 2 %-1:200000 IJ SOLN
10.0000 mL | Freq: Once | INTRAMUSCULAR | Status: AC
  Administered 2015-03-09: 10 mL via INTRADERMAL
  Filled 2015-03-09: qty 20
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
## 2015-03-09 NOTE — ED Notes (Signed)
[facility] 6 days go. Pt with sutures to L mid back. Pt c.o increased pain to area and knot. No drainage noted. Denies fevers.

## 2015-03-09 NOTE — Discharge Instructions (Signed)
Please apply warm compress to affected area 4 times daily for the next 5 days.  Take antibiotics and pain medication as prescribed.  Packing can be removed in 2 days.  Return in 48 hrs if you notice no improvement.  Abscess An abscess is an infected area that contains a collection of pus and debris.It can occur in almost any part of the body. An abscess is also known as a furuncle or boil. CAUSES  An abscess occurs when tissue gets infected. This can occur from blockage of oil or sweat glands, infection of hair follicles, or a minor injury to the skin. As the body tries to fight the infection, pus collects in the area and creates pressure under the skin. This pressure causes pain. People with weakened immune systems have difficulty fighting infections and get certain abscesses more often.  SYMPTOMS Usually an abscess develops on the skin and becomes a painful mass that is red, warm, and tender. If the abscess forms under the skin, you may feel a moveable soft area under the skin. Some abscesses break open (rupture) on their own, but most will continue to get worse without care. The infection can spread deeper into the body and eventually into the bloodstream, causing you to feel ill.  DIAGNOSIS  Your caregiver will take your medical history and perform a physical exam. A sample of fluid may also be taken from the abscess to determine what is causing your infection. TREATMENT  Your caregiver may prescribe antibiotic medicines to fight the infection. However, taking antibiotics alone usually does not cure an abscess. Your caregiver may need to make a small cut (incision) in the abscess to drain the pus. In some cases, gauze is packed into the abscess to reduce pain and to continue draining the area. HOME CARE INSTRUCTIONS   Only take over-the-counter or prescription medicines for pain, discomfort, or fever as directed by your caregiver.  If you were prescribed antibiotics, take them as directed.  Finish them even if you start to feel better.  If gauze is used, follow your caregiver's directions for changing the gauze.  To avoid spreading the infection:  Keep your draining abscess covered with a bandage.  Wash your hands well.  Do not share personal care items, towels, or whirlpools with others.  Avoid skin contact with others.  Keep your skin and clothes clean around the abscess.  Keep all follow-up appointments as directed by your caregiver. SEEK MEDICAL CARE IF:   You have increased pain, swelling, redness, fluid drainage, or bleeding.  You have muscle aches, chills, or a general ill feeling.  You have a fever. MAKE SURE YOU:   Understand these instructions.  Will watch your condition.  Will get help right away if you are not doing well or get worse.   This information is not intended to replace advice given to you by your health care provider. Make sure you discuss any questions you have with your health care provider.   Document Released: 02/20/2005 Document Revised: 11/12/2011 Document Reviewed: 07/26/2011 Elsevier Interactive Patient Education [employer]2016 [employer] Inc.

## 2016-01-29 ENCOUNTER — Emergency Department (HOSPITAL_COMMUNITY)
Admission: EM | Admit: 2016-01-29 | Discharge: 2016-01-29 | Disposition: A | Discharge status: Home / Self Care | Payer: Self-pay | Attending: Emergency Medicine | Admitting: Emergency Medicine

## 2016-01-29 ENCOUNTER — Encounter (HOSPITAL_COMMUNITY): Payer: Self-pay | Admitting: Emergency Medicine

## 2016-01-29 ENCOUNTER — Emergency Department (HOSPITAL_COMMUNITY): Payer: Self-pay

## 2016-01-29 DIAGNOSIS — S99921A Unspecified injury of right foot, initial encounter: Secondary | ICD-10-CM | POA: Insufficient documentation

## 2016-01-29 DIAGNOSIS — F17200 Nicotine dependence, unspecified, uncomplicated: Secondary | ICD-10-CM | POA: Insufficient documentation

## 2016-01-29 DIAGNOSIS — F909 Attention-deficit hyperactivity disorder, unspecified type: Secondary | ICD-10-CM | POA: Insufficient documentation

## 2016-01-29 DIAGNOSIS — Y990 Civilian activity done for income or pay: Secondary | ICD-10-CM | POA: Insufficient documentation

## 2016-01-29 DIAGNOSIS — Y929 Unspecified place or not applicable: Secondary | ICD-10-CM | POA: Insufficient documentation

## 2016-01-29 DIAGNOSIS — W228XXA Striking against or struck by other objects, initial encounter: Secondary | ICD-10-CM | POA: Insufficient documentation

## 2016-01-29 DIAGNOSIS — Y9335 Activity, hang gliding: Secondary | ICD-10-CM | POA: Insufficient documentation

## 2016-01-29 NOTE — ED Notes (Signed)
Patient transported to X-ray 

## 2016-01-29 NOTE — ED Notes (Signed)
Bed: [id number] Expected date:  Expected time:  Means of arrival:  Comments: EMS 25 yo male from home-right foot pain/kicked a metal pole

## 2016-01-29 NOTE — Discharge Instructions (Signed)
Rest, ice, compression, elevation. Take Ibuprofen 600mg  three times a day for the next 5 days. Follow up with your family doctor or orthopedics if your symptoms are not improving.

## 2016-01-29 NOTE — ED Triage Notes (Signed)
Pt was working out with a heavy punching bag that was hanging on a heavy metal pole.  Pt meant to kick the bag but hit the metal pole.  Pt reports pain when brings toes up.

## 2016-01-30 NOTE — ED Provider Notes (Signed)
[facility]-EMERGENCY DEPT Provider Note   CSN: [id number] Arrival date & time: 01/29/16  2126     History   Chief Complaint Chief Complaint  Patient presents with  . Foot Injury    HPI Fernando Mccormick is a 25 y.o. male presents with right ankle pain. No significant past medical history. He states he was punching a punching bag earlier today and decided to kick it. He missed the punching bag and is taking a pole the top of his foot. He reported immediate onset of pain and difficulty ambulating. He came to the emergency room for further evaluation. Denies previous injury to his ankle or foot. Denies numbness or tingling or open wounds.  HPI  Past Medical History:  Diagnosis Date  . ADHD (attention deficit hyperactivity disorder)     There are no active problems to display for this patient.   Past Surgical History:  Procedure Laterality Date  . ORTHOPEDIC SURGERY  2008   left wrist fracture with internal fixation       Home Medications    Prior to Admission medications   Medication Sig Start Date End Date Taking? Authorizing Provider  ibuprofen (ADVIL,MOTRIN) 200 MG tablet Take 600 mg by mouth every 6 (six) hours as needed for headache or mild pain.    Historical Provider, MD  traMADol (ULTRAM) 50 MG tablet Take 1 tablet (50 mg total) by mouth every 6 (six) hours as needed. 03/09/15   Fayrene Helper, PA-C    Family History History reviewed. No pertinent family history.  Social History Social History  Substance Use Topics  . Smoking status: Current Every Day Smoker    Packs/day: 0.25  . Smokeless tobacco: Not on file  . Alcohol use Yes     Comment: occasionally     Allergies   Shellfish allergy   Review of Systems Review of Systems  Musculoskeletal: Positive for arthralgias and gait problem. Negative for joint swelling.  Neurological: Negative for weakness and numbness.  All other systems reviewed and are negative.    Physical Exam Updated Vital Signs BP 122/65  (BP Location: Right Arm)   Pulse 68   Temp 98.3 F (36.8 C) (Oral)   Resp 16   SpO2 98%   Physical Exam  Constitutional: He is oriented to person, place, and time. He appears well-developed and well-nourished. No distress.  HENT:  Head: Normocephalic and atraumatic.  Eyes: Conjunctivae are normal. Pupils are equal, round, and reactive to light. Right eye exhibits no discharge. Left eye exhibits no discharge. No scleral icterus.  Neck: Normal range of motion. Neck supple.  Cardiovascular: Normal rate and regular rhythm.   No murmur heard. Pulmonary/Chest: Effort normal and breath sounds normal. No respiratory distress.  Abdominal: Soft. He exhibits no distension. There is no tenderness.  Musculoskeletal: He exhibits no edema.  R ankle and foot: No obvious swelling or deformity. Tenderness to palpation over dorsal aspect of food.  Decreased ROM of ankle due to pain. FROM of toes. N/V intact.    Neurological: He is alert and oriented to person, place, and time.  Skin: Skin is warm and dry.  Psychiatric: He has a normal mood and affect.  Nursing note and vitals reviewed.    ED Treatments / Results  Labs (all labs ordered are listed, but only abnormal results are displayed) Labs Reviewed - No data to display  EKG  EKG Interpretation None       Radiology Dg Foot Complete Right  Result Date: 01/29/2016 CLINICAL DATA:  Initial evaluation for acute trauma, pain and bruising and third through fifth metatarsal. EXAM: RIGHT FOOT COMPLETE - 3+ VIEW COMPARISON:  Prior radiograph from 02/28/2004.  The FINDINGS: There is no evidence of fracture or dislocation. There is no evidence of arthropathy or other focal bone abnormality. Soft tissues are unremarkable. IMPRESSION: No acute osseous abnormality about the right foot. Electronically Signed   By: Rise MuBenjamin  McClintock M.D.   On: 01/29/2016 22:07    Procedures Procedures (including critical care time)  Medications Ordered in  ED Medications - No data to display   Initial Impression / Assessment and Plan / ED Course  I have reviewed the triage vital signs and the nursing notes.  Pertinent labs & imaging results that were available during my care of the patient were reviewed by me and considered in my medical decision making (see chart for details).  Clinical Course   25 year old male who presents with a foot injury. Xrays are negative for bony pathology. Will treat as MSK pain. ASO brace and crutches given. RICE protocol discussed. Ortho follow up given. Patient is NAD, non-toxic, with stable VS. Patient is informed of clinical course, understands medical decision making process, and agrees with plan. Opportunity for questions provided and all questions answered. Return precautions given.    Final Clinical Impressions(s) / ED Diagnoses   Final diagnoses:  Foot injury, right, initial encounter    New Prescriptions Discharge Medication List as of 01/29/2016 11:02 PM       Bethel BornKelly Marie Lamone Ferrelli, PA-C 01/30/16 1720    Lyndal Pulleyaniel Knott, MD 01/31/16 [phone]

## 2016-05-23 ENCOUNTER — Encounter (HOSPITAL_COMMUNITY): Payer: Self-pay | Admitting: *Deleted

## 2016-05-23 ENCOUNTER — Emergency Department (HOSPITAL_COMMUNITY)
Admission: EM | Admit: 2016-05-23 | Discharge: 2016-05-24 | Disposition: A | Discharge status: Home / Self Care | Payer: Self-pay | Attending: Emergency Medicine | Admitting: Emergency Medicine

## 2016-05-23 DIAGNOSIS — K6289 Other specified diseases of anus and rectum: Secondary | ICD-10-CM | POA: Insufficient documentation

## 2016-05-23 DIAGNOSIS — F17200 Nicotine dependence, unspecified, uncomplicated: Secondary | ICD-10-CM | POA: Insufficient documentation

## 2016-05-23 DIAGNOSIS — R197 Diarrhea, unspecified: Secondary | ICD-10-CM | POA: Insufficient documentation

## 2016-05-23 DIAGNOSIS — F909 Attention-deficit hyperactivity disorder, unspecified type: Secondary | ICD-10-CM | POA: Insufficient documentation

## 2016-05-23 DIAGNOSIS — Z202 Contact with and (suspected) exposure to infections with a predominantly sexual mode of transmission: Secondary | ICD-10-CM | POA: Insufficient documentation

## 2016-05-23 HISTORY — DX: Bipolar disorder, unspecified: F31.9

## 2016-05-23 LAB — CBC WITH DIFFERENTIAL/PLATELET
BASOS ABS: 0 10*3/uL (ref 0.0–0.1)
Basophils Relative: 1 %
EOS PCT: 3 %
Eosinophils Absolute: 0.1 10*3/uL (ref 0.0–0.7)
HEMATOCRIT: 44.8 % (ref 39.0–52.0)
Hemoglobin: 14.6 g/dL (ref 13.0–17.0)
LYMPHS ABS: 2.6 10*3/uL (ref 0.7–4.0)
LYMPHS PCT: 46 %
MCH: 27.1 pg (ref 26.0–34.0)
MCHC: 32.6 g/dL (ref 30.0–36.0)
MCV: 83.3 fL (ref 78.0–100.0)
MONO ABS: 0.5 10*3/uL (ref 0.1–1.0)
MONOS PCT: 9 %
NEUTROS ABS: 2.3 10*3/uL (ref 1.7–7.7)
Neutrophils Relative %: 41 %
Platelets: 227 10*3/uL (ref 150–400)
RBC: 5.38 MIL/uL (ref 4.22–5.81)
RDW: 12.8 % (ref 11.5–15.5)
WBC: 5.6 10*3/uL (ref 4.0–10.5)

## 2016-05-23 NOTE — ED Notes (Signed)
Patient states he has had diarrhea x 3-4 days and is having pain at his anus area. Denies bloody stools.

## 2016-05-23 NOTE — ED Triage Notes (Signed)
Pt comes in with diarrhea for 3-4 days. Pt states he hasn't had an appetite either. Denies any n/v. In addition, pt states he feels as if there is something foreign at his anus. States he has a "weird feeling" back there. Pt denies putting anything in his anus. NAD noted. Denies any rectal bleeding.

## 2016-05-24 LAB — URINALYSIS, ROUTINE W REFLEX MICROSCOPIC
BILIRUBIN URINE: NEGATIVE
Glucose, UA: NEGATIVE mg/dL
Hgb urine dipstick: NEGATIVE
KETONES UR: NEGATIVE mg/dL
LEUKOCYTES UA: NEGATIVE
NITRITE: NEGATIVE
PROTEIN: NEGATIVE mg/dL
Specific Gravity, Urine: 1.026 (ref 1.005–1.030)
pH: 6 (ref 5.0–8.0)

## 2016-05-24 LAB — COMPREHENSIVE METABOLIC PANEL
ALT: 18 U/L (ref 17–63)
ANION GAP: 6 (ref 5–15)
AST: 18 U/L (ref 15–41)
Albumin: 4.3 g/dL (ref 3.5–5.0)
Alkaline Phosphatase: 51 U/L (ref 38–126)
BILIRUBIN TOTAL: 0.6 mg/dL (ref 0.3–1.2)
BUN: 14 mg/dL (ref 6–20)
CALCIUM: 9.3 mg/dL (ref 8.9–10.3)
CO2: 30 mmol/L (ref 22–32)
Chloride: 102 mmol/L (ref 101–111)
Creatinine, Ser: 1.02 mg/dL (ref 0.61–1.24)
Glucose, Bld: 104 mg/dL — ABNORMAL HIGH (ref 65–99)
Potassium: 3.9 mmol/L (ref 3.5–5.1)
Sodium: 138 mmol/L (ref 135–145)
TOTAL PROTEIN: 7.6 g/dL (ref 6.5–8.1)

## 2016-05-24 MED ORDER — DIPHENOXYLATE-ATROPINE 2.5-0.025 MG PO TABS
2.0000 | ORAL_TABLET | Freq: Once | ORAL | Status: AC
  Administered 2016-05-24: 2 via ORAL
  Filled 2016-05-24: qty 2

## 2016-05-24 MED ORDER — METRONIDAZOLE 500 MG PO TABS: 500.0000 mg | ORAL_TABLET | Freq: Two times a day (BID) | ORAL | 0 refills | Status: DC

## 2016-05-24 MED ORDER — DIPHENOXYLATE-ATROPINE 2.5-0.025 MG PO TABS: 1.0000 | ORAL_TABLET | Freq: Four times a day (QID) | ORAL | 0 refills | Status: DC | PRN

## 2016-05-24 NOTE — Discharge Instructions (Signed)
As discussed, you have been screened for multiple std's including gonorrhea, chlamydia and hiv .  These tests are not back yet as they take several days.  You will be notified if any are positive.  You are being treated for trichomonas tonight since you have been exposed to this std.  Do not drink alcohol while taking the metronidazole as you will get very sick with nausea and vomiting if you do.

## 2016-05-25 LAB — HIV ANTIBODY (ROUTINE TESTING W REFLEX): HIV SCREEN 4TH GENERATION: NONREACTIVE

## 2016-05-25 LAB — RPR: RPR: NONREACTIVE

## 2016-05-25 NOTE — ED Provider Notes (Signed)
[facility]-EMERGENCY DEPT Provider Note   CSN: [id number] Arrival date & time: 05/23/16  1812     History   Chief Complaint Chief Complaint  Patient presents with  . Diarrhea    HPI Fernando Mccormick is a 25 y.o. male presenting with a 4 day history of diarrhea, reporting 3-4 episodes daily of brown, water, nonbloody stools. Since yesterday he also reports pain and itching at his rectum. He denies fevers, chills, nausea, vomiting, abdominal or pelvic pain. He denies any recent antibiotic exposures, no foreign travel or suspicion for food poisoning. He lives with grandparents who are asymptomatic.  He also raises concern for possible std, stating he was recently told by a new male partner that she has trichomonas.  He denies penile discharge, dysuria and denies anal sex. He does not use condoms.  The history is provided by the patient.    Past Medical History:  Diagnosis Date  . ADHD (attention deficit hyperactivity disorder)   . Bipolar 1 disorder (HCC)     There are no active problems to display for this patient.   Past Surgical History:  Procedure Laterality Date  . BACK SURGERY    . ORTHOPEDIC SURGERY  2008   left wrist fracture with internal fixation       Home Medications    Prior to Admission medications   Medication Sig Start Date End Date Taking? Authorizing Provider  diphenoxylate-atropine (LOMOTIL) 2.5-0.025 MG tablet Take 1 tablet by mouth 4 (four) times daily as needed for diarrhea or loose stools. 05/24/16   Burgess AmorJulie Tresa Jolley, PA-C  metroNIDAZOLE (FLAGYL) 500 MG tablet Take 1 tablet (500 mg total) by mouth 2 (two) times daily. 05/24/16   Burgess AmorJulie Ulises Wolfinger, PA-C    Family History No family history on file.  Social History Social History  Substance Use Topics  . Smoking status: Current Every Day Smoker    Packs/day: 0.25  . Smokeless tobacco: Never Used  . Alcohol use Yes     Comment: 2-3 times a week     Allergies   Shellfish allergy   Review of Systems Review  of Systems  Constitutional: Negative for fever.  HENT: Negative for congestion and sore throat.   Eyes: Negative.   Respiratory: Negative for chest tightness and shortness of breath.   Cardiovascular: Negative for chest pain.  Gastrointestinal: Positive for diarrhea and rectal pain. Negative for abdominal pain, anal bleeding, blood in stool and nausea.  Genitourinary: Negative.  Negative for discharge, dysuria, hematuria and penile pain.  Musculoskeletal: Negative for arthralgias, joint swelling and neck pain.  Skin: Negative.  Negative for rash and wound.  Neurological: Negative for dizziness, weakness, light-headedness, numbness and headaches.  Psychiatric/Behavioral: Negative.      Physical Exam Updated Vital Signs BP 122/58 (BP Location: Left Arm)   Pulse 81   Temp 98.2 F (36.8 C) (Oral)   Resp 18   Ht 6' (1.829 m)   Wt 74.8 kg   SpO2 98%   BMI 22.38 kg/m   Physical Exam  Constitutional: He appears well-developed and well-nourished.  HENT:  Head: Normocephalic and atraumatic.  Eyes: Conjunctivae are normal.  Neck: Normal range of motion.  Cardiovascular: Normal rate, regular rhythm, normal heart sounds and intact distal pulses.   Pulmonary/Chest: Effort normal and breath sounds normal. He has no wheezes.  Abdominal: Soft. Bowel sounds are normal. There is no tenderness.  Genitourinary: Rectum normal and penis normal. Rectal exam shows no external hemorrhoid, no internal hemorrhoid, no fissure, no mass, no tenderness  and anal tone normal.  Genitourinary Comments: Chaperone was present during exam.   Musculoskeletal: Normal range of motion.  Neurological: He is alert.  Skin: Skin is warm and dry.  Psychiatric: He has a normal mood and affect.  Nursing note and vitals reviewed.    ED Treatments / Results  Labs (all labs ordered are listed, but only abnormal results are displayed) Labs Reviewed  COMPREHENSIVE METABOLIC PANEL - Abnormal; Notable for the following:        Result Value   Glucose, Bld 104 (*)    All other components within normal limits  CBC WITH DIFFERENTIAL/PLATELET  URINALYSIS, ROUTINE W REFLEX MICROSCOPIC  RPR  HIV ANTIBODY (ROUTINE TESTING)  GC/CHLAMYDIA PROBE AMP ([place] NOT [facility]    EKG  EKG Interpretation None       Radiology No results found.  Procedures Procedures (including critical care time)  Medications Ordered in ED Medications  diphenoxylate-atropine (LOMOTIL) 2.5-0.025 MG per tablet 2 tablet (2 tablets Oral Given 05/24/16 0027)     Initial Impression / Assessment and Plan / ED Course  I have reviewed the triage vital signs and the nursing notes.  Pertinent labs & imaging results that were available during my care of the patient were reviewed by me and considered in my medical decision making (see chart for details).  Clinical Course     Pt had no diarrhea while in the dept.  He was given one dose of lomotil prior to dc, prescription for additional, but advised to only take if sx persist.  He was also started on flagyl given trichomonas exposure.  Pt aware other labs pending.  Advised prn f/u if sx persist.    The patient appears reasonably screened and/or stabilized for discharge and I doubt any other medical condition or other [facility] requiring further screening, evaluation, or treatment in the ED at this time prior to discharge.   Final Clinical Impressions(s) / ED Diagnoses   Final diagnoses:  Diarrhea of presumed infectious origin  Exposure to trichomonas    New Prescriptions Discharge Medication List as of 05/24/2016  1:06 AM    START taking these medications   Details  diphenoxylate-atropine (LOMOTIL) 2.5-0.025 MG tablet Take 1 tablet by mouth 4 (four) times daily as needed for diarrhea or loose stools., Starting Fri 05/24/2016, Print    metroNIDAZOLE (FLAGYL) 500 MG tablet Take 1 tablet (500 mg total) by mouth 2 (two) times daily., Starting Fri 05/24/2016, Print         Burgess AmorJulie  Nehemyah Foushee, PA-C 05/25/16 1307    Canary Brimhristopher J Tegeler, MD 05/25/16 2230

## 2016-05-28 LAB — GC/CHLAMYDIA PROBE AMP (~~LOC~~) NOT AT ARMC
Chlamydia: NEGATIVE
Neisseria Gonorrhea: NEGATIVE

## 2017-11-24 ENCOUNTER — Emergency Department (HOSPITAL_COMMUNITY)
Admission: EM | Admit: 2017-11-24 | Discharge: 2017-11-24 | Disposition: A | Discharge status: Home / Self Care | Payer: Self-pay | Attending: Emergency Medicine | Admitting: Emergency Medicine

## 2017-11-24 ENCOUNTER — Other Ambulatory Visit: Payer: Self-pay

## 2017-11-24 ENCOUNTER — Encounter (HOSPITAL_COMMUNITY): Payer: Self-pay | Admitting: Emergency Medicine

## 2017-11-24 ENCOUNTER — Emergency Department (HOSPITAL_COMMUNITY): Payer: Self-pay

## 2017-11-24 DIAGNOSIS — W228XXA Striking against or struck by other objects, initial encounter: Secondary | ICD-10-CM | POA: Insufficient documentation

## 2017-11-24 DIAGNOSIS — S8012XA Contusion of left lower leg, initial encounter: Secondary | ICD-10-CM | POA: Insufficient documentation

## 2017-11-24 DIAGNOSIS — Y999 Unspecified external cause status: Secondary | ICD-10-CM | POA: Insufficient documentation

## 2017-11-24 DIAGNOSIS — Y929 Unspecified place or not applicable: Secondary | ICD-10-CM | POA: Insufficient documentation

## 2017-11-24 DIAGNOSIS — F17210 Nicotine dependence, cigarettes, uncomplicated: Secondary | ICD-10-CM | POA: Insufficient documentation

## 2017-11-24 DIAGNOSIS — F909 Attention-deficit hyperactivity disorder, unspecified type: Secondary | ICD-10-CM | POA: Insufficient documentation

## 2017-11-24 DIAGNOSIS — Y939 Activity, unspecified: Secondary | ICD-10-CM | POA: Insufficient documentation

## 2017-11-24 DIAGNOSIS — Z79899 Other long term (current) drug therapy: Secondary | ICD-10-CM | POA: Insufficient documentation

## 2017-11-24 MED ORDER — IBUPROFEN 600 MG PO TABS: 600.0000 mg | ORAL_TABLET | Freq: Four times a day (QID) | ORAL | 0 refills | Status: DC | PRN
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
## 2017-11-24 NOTE — Discharge Instructions (Addendum)
Follow up with your doctor if pain continues.

## 2018-01-15 ENCOUNTER — Emergency Department (HOSPITAL_COMMUNITY): Payer: No Typology Code available for payment source

## 2018-01-15 ENCOUNTER — Emergency Department (HOSPITAL_COMMUNITY)
Admission: EM | Admit: 2018-01-15 | Discharge: 2018-01-15 | Disposition: A | Discharge status: Home / Self Care | Payer: No Typology Code available for payment source | Attending: Emergency Medicine | Admitting: Emergency Medicine

## 2018-01-15 ENCOUNTER — Encounter (HOSPITAL_COMMUNITY): Payer: Self-pay | Admitting: Emergency Medicine

## 2018-01-15 DIAGNOSIS — F17210 Nicotine dependence, cigarettes, uncomplicated: Secondary | ICD-10-CM | POA: Diagnosis not present

## 2018-01-15 DIAGNOSIS — S02839A Fracture of medial orbital wall, unspecified side, initial encounter for closed fracture: Secondary | ICD-10-CM

## 2018-01-15 DIAGNOSIS — Y999 Unspecified external cause status: Secondary | ICD-10-CM | POA: Diagnosis not present

## 2018-01-15 DIAGNOSIS — Y9389 Activity, other specified: Secondary | ICD-10-CM | POA: Insufficient documentation

## 2018-01-15 DIAGNOSIS — S299XXA Unspecified injury of thorax, initial encounter: Secondary | ICD-10-CM | POA: Diagnosis not present

## 2018-01-15 DIAGNOSIS — S0990XA Unspecified injury of head, initial encounter: Secondary | ICD-10-CM | POA: Diagnosis present

## 2018-01-15 DIAGNOSIS — S3993XA Unspecified injury of pelvis, initial encounter: Secondary | ICD-10-CM | POA: Insufficient documentation

## 2018-01-15 DIAGNOSIS — S0502XA Injury of conjunctiva and corneal abrasion without foreign body, left eye, initial encounter: Secondary | ICD-10-CM

## 2018-01-15 DIAGNOSIS — F10921 Alcohol use, unspecified with intoxication delirium: Secondary | ICD-10-CM

## 2018-01-15 DIAGNOSIS — S0232XA Fracture of orbital floor, left side, initial encounter for closed fracture: Secondary | ICD-10-CM | POA: Diagnosis not present

## 2018-01-15 DIAGNOSIS — S060X1A Concussion with loss of consciousness of 30 minutes or less, initial encounter: Secondary | ICD-10-CM

## 2018-01-15 DIAGNOSIS — Z978 Presence of other specified devices: Secondary | ICD-10-CM

## 2018-01-15 DIAGNOSIS — Z23 Encounter for immunization: Secondary | ICD-10-CM | POA: Insufficient documentation

## 2018-01-15 DIAGNOSIS — S0003XA Contusion of scalp, initial encounter: Secondary | ICD-10-CM | POA: Insufficient documentation

## 2018-01-15 DIAGNOSIS — Y929 Unspecified place or not applicable: Secondary | ICD-10-CM | POA: Diagnosis not present

## 2018-01-15 LAB — URINALYSIS, ROUTINE W REFLEX MICROSCOPIC
BILIRUBIN URINE: NEGATIVE
Glucose, UA: NEGATIVE mg/dL
Hgb urine dipstick: NEGATIVE
Ketones, ur: NEGATIVE mg/dL
LEUKOCYTES UA: NEGATIVE
NITRITE: NEGATIVE
PH: 6.5 (ref 5.0–8.0)
Protein, ur: NEGATIVE mg/dL
SPECIFIC GRAVITY, URINE: 1.015 (ref 1.005–1.030)

## 2018-01-15 LAB — COMPREHENSIVE METABOLIC PANEL
ALBUMIN: 4.4 g/dL (ref 3.5–5.0)
ALT: 39 U/L (ref 0–44)
ANION GAP: 9 (ref 5–15)
AST: 36 U/L (ref 15–41)
Alkaline Phosphatase: 58 U/L (ref 38–126)
BILIRUBIN TOTAL: 0.5 mg/dL (ref 0.3–1.2)
BUN: 15 mg/dL (ref 6–20)
CHLORIDE: 109 mmol/L (ref 98–111)
CO2: 25 mmol/L (ref 22–32)
Calcium: 8.9 mg/dL (ref 8.9–10.3)
Creatinine, Ser: 1.36 mg/dL — ABNORMAL HIGH (ref 0.61–1.24)
GFR calc Af Amer: >60 mL/min (ref >60)
Glucose, Bld: 112 mg/dL — ABNORMAL HIGH (ref 70–99)
POTASSIUM: 3.4 mmol/L — AB (ref 3.5–5.1)
Sodium: 143 mmol/L (ref 135–145)
TOTAL PROTEIN: 7.4 g/dL (ref 6.5–8.1)

## 2018-01-15 LAB — ETHANOL: Alcohol, Ethyl (B): 194 mg/dL — ABNORMAL HIGH (ref <10)

## 2018-01-15 LAB — CBC
HEMATOCRIT: 42.5 % (ref 39.0–52.0)
HEMOGLOBIN: 14.4 g/dL (ref 13.0–17.0)
MCH: 28.1 pg (ref 26.0–34.0)
MCHC: 33.9 g/dL (ref 30.0–36.0)
MCV: 82.8 fL (ref 78.0–100.0)
Platelets: 258 10*3/uL (ref 150–400)
RBC: 5.13 MIL/uL (ref 4.22–5.81)
RDW: 12.8 % (ref 11.5–15.5)
WBC: 7.2 10*3/uL (ref 4.0–10.5)

## 2018-01-15 LAB — PROTIME-INR
INR: 0.88
PROTHROMBIN TIME: 11.9 s (ref 11.4–15.2)

## 2018-01-15 MED ORDER — ONDANSETRON HCL 4 MG/2ML IJ SOLN
INTRAMUSCULAR | Status: AC
  Administered 2018-01-15: 4 mg
  Filled 2018-01-15: qty 2

## 2018-01-15 MED ORDER — PROPOFOL 1000 MG/100ML IV EMUL
5.0000 ug/kg/min | Freq: Once | INTRAVENOUS | Status: AC
  Administered 2018-01-15: 5 ug/kg/min via INTRAVENOUS

## 2018-01-15 MED ORDER — KETAMINE HCL 50 MG/ML IJ SOLN
200.0000 mg | Freq: Once | INTRAMUSCULAR | Status: AC
  Administered 2018-01-15: 200 mg via INTRAMUSCULAR

## 2018-01-15 MED ORDER — PROMETHAZINE HCL 25 MG/ML IJ SOLN
INTRAMUSCULAR | Status: AC
  Administered 2018-01-15: 12.5 mg via INTRAVENOUS
  Filled 2018-01-15: qty 1

## 2018-01-15 MED ORDER — PROPOFOL 1000 MG/100ML IV EMUL
INTRAVENOUS | Status: AC
  Administered 2018-01-15: 5 ug/kg/min via INTRAVENOUS
  Filled 2018-01-15: qty 100

## 2018-01-15 MED ORDER — KETAMINE HCL 10 MG/ML IJ SOLN: 50.0000 mg | Freq: Once | INTRAMUSCULAR | Status: DC

## 2018-01-15 MED ORDER — SODIUM CHLORIDE 0.9 % IV SOLN
Freq: Once | INTRAVENOUS | Status: AC
  Administered 2018-01-15: 04:00:00 via INTRAVENOUS

## 2018-01-15 MED ORDER — PROMETHAZINE HCL 25 MG/ML IJ SOLN
12.5000 mg | Freq: Once | INTRAMUSCULAR | Status: AC
  Administered 2018-01-15: 12.5 mg via INTRAVENOUS

## 2018-01-15 MED ORDER — ACETAMINOPHEN 325 MG PO TABS
650.0000 mg | ORAL_TABLET | Freq: Once | ORAL | Status: AC
  Administered 2018-01-15: 650 mg via ORAL
  Filled 2018-01-15: qty 2

## 2018-01-15 MED ORDER — KETAMINE HCL 10 MG/ML IJ SOLN
50.0000 mg | Freq: Once | INTRAMUSCULAR | Status: AC
  Administered 2018-01-15: 50 mg via INTRAVENOUS

## 2018-01-15 MED ORDER — TETANUS-DIPHTH-ACELL PERTUSSIS 5-2.5-18.5 LF-MCG/0.5 IM SUSP
INTRAMUSCULAR | Status: AC
  Filled 2018-01-15: qty 0.5

## 2018-01-15 MED ORDER — ROCURONIUM BROMIDE 50 MG/5ML IV SOLN
100.0000 mg | Freq: Once | INTRAVENOUS | Status: AC
  Administered 2018-01-15: 100 mg via INTRAVENOUS
  Filled 2018-01-15: qty 10

## 2018-01-15 MED ORDER — TOBRAMYCIN 0.3 % OP SOLN
2.0000 [drp] | Freq: Once | OPHTHALMIC | Status: AC
  Administered 2018-01-15: 2 [drp] via OPHTHALMIC
  Filled 2018-01-15: qty 5

## 2018-01-15 MED ORDER — KETAMINE HCL 50 MG/ML IJ SOLN
INTRAMUSCULAR | Status: AC
  Filled 2018-01-15: qty 10

## 2018-01-15 MED ORDER — TETRACAINE HCL 0.5 % OP SOLN
OPHTHALMIC | Status: AC
  Administered 2018-01-15: 14:00:00
  Filled 2018-01-15: qty 4

## 2018-01-15 MED ORDER — MIDAZOLAM HCL 2 MG/2ML IJ SOLN
2.0000 mg | INTRAMUSCULAR | Status: DC | PRN
  Administered 2018-01-15 (×2): 2 mg via INTRAVENOUS
  Filled 2018-01-15 (×2): qty 2

## 2018-01-15 MED ORDER — KETOROLAC TROMETHAMINE 0.5 % OP SOLN
1.0000 [drp] | Freq: Once | OPHTHALMIC | Status: AC
  Administered 2018-01-15: 1 [drp] via OPHTHALMIC
  Filled 2018-01-15: qty 5

## 2018-01-15 MED ORDER — TETANUS-DIPHTH-ACELL PERTUSSIS 5-2.5-18.5 LF-MCG/0.5 IM SUSP
0.5000 mL | Freq: Once | INTRAMUSCULAR | Status: AC
  Administered 2018-01-15: 0.5 mL via INTRAMUSCULAR
  Filled 2018-01-15: qty 0.5

## 2018-01-15 MED ORDER — IOPAMIDOL (ISOVUE-300) INJECTION 61%
100.0000 mL | Freq: Once | INTRAVENOUS | Status: AC | PRN
  Administered 2018-01-15: 100 mL via INTRAVENOUS

## 2018-01-15 MED ORDER — FLUORESCEIN SODIUM 1 MG OP STRP
ORAL_STRIP | OPHTHALMIC | Status: AC
  Administered 2018-01-15: 1
  Filled 2018-01-15: qty 1

## 2018-01-15 MED ORDER — KETAMINE HCL 10 MG/ML IJ SOLN
150.0000 mg | Freq: Once | INTRAMUSCULAR | Status: AC
  Administered 2018-01-15: 150 mg via INTRAVENOUS

## 2018-01-15 NOTE — ED Notes (Signed)
Patient refuses to allow SpO2 monitor to be placed.
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
## 2018-01-15 NOTE — ED Notes (Signed)
Pt moving and groaning

## 2018-01-15 NOTE — ED Notes (Signed)
Family in room  
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
## 2018-01-15 NOTE — Discharge Instructions (Signed)
Use the ketorolac drops 1 in the left eye 4 times a day to help relieve pain.  Use the tobramycin drops 2 in the left eye 4 times a day for several days to prevent infection.  Use ice on the sore areas of your face, 3 or 4 times a day for 20 minutes.  Take Tylenol if needed for pain.  Avoid drinking alcohol.  Return here, if needed, for problems.

## 2018-01-15 NOTE — ED Notes (Signed)
Patient tolerating fluids. 
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
## 2018-01-15 NOTE — ED Notes (Signed)
Patient covered in feces. Patient refuses to let this nurse or staff clean him and do bed change.

## 2018-10-14 ENCOUNTER — Emergency Department (HOSPITAL_COMMUNITY): Payer: Self-pay

## 2018-10-14 ENCOUNTER — Encounter (HOSPITAL_COMMUNITY): Payer: Self-pay | Admitting: Emergency Medicine

## 2018-10-14 ENCOUNTER — Emergency Department (HOSPITAL_COMMUNITY)
Admission: EM | Admit: 2018-10-14 | Discharge: 2018-10-14 | Disposition: A | Discharge status: Home / Self Care | Payer: Self-pay | Attending: Emergency Medicine | Admitting: Emergency Medicine

## 2018-10-14 ENCOUNTER — Other Ambulatory Visit: Payer: Self-pay

## 2018-10-14 DIAGNOSIS — Y9389 Activity, other specified: Secondary | ICD-10-CM | POA: Insufficient documentation

## 2018-10-14 DIAGNOSIS — F17210 Nicotine dependence, cigarettes, uncomplicated: Secondary | ICD-10-CM | POA: Insufficient documentation

## 2018-10-14 DIAGNOSIS — W230XXA Caught, crushed, jammed, or pinched between moving objects, initial encounter: Secondary | ICD-10-CM | POA: Insufficient documentation

## 2018-10-14 DIAGNOSIS — S6991XA Unspecified injury of right wrist, hand and finger(s), initial encounter: Secondary | ICD-10-CM

## 2018-10-14 DIAGNOSIS — Y929 Unspecified place or not applicable: Secondary | ICD-10-CM | POA: Insufficient documentation

## 2018-10-14 DIAGNOSIS — Y999 Unspecified external cause status: Secondary | ICD-10-CM | POA: Insufficient documentation

## 2018-10-14 DIAGNOSIS — M20019 Mallet finger of unspecified finger(s): Secondary | ICD-10-CM

## 2018-10-14 DIAGNOSIS — M20011 Mallet finger of right finger(s): Secondary | ICD-10-CM | POA: Insufficient documentation

## 2018-10-14 NOTE — ED Provider Notes (Signed)
[facility] EMERGENCY DEPARTMENT Provider Note   CSN: [id number] Arrival date & time: 10/14/18  1122    History   Chief Complaint Chief Complaint  Patient presents with  . Finger Injury    HPI Fernando Mccormick is a 28 y.o. male.     The history is provided by the patient. No language interpreter was used.  Hand Pain  This is a new problem. The current episode started yesterday. The problem occurs constantly. The problem has been gradually worsening. Nothing aggravates the symptoms. Nothing relieves the symptoms. He has tried nothing for the symptoms. The treatment provided no relief.   Pt reports he closed a window on his finger last night  Past Medical History:  Diagnosis Date  . ADHD (attention deficit hyperactivity disorder)   . Bipolar 1 disorder (HCC)     There are no active problems to display for this patient.   Past Surgical History:  Procedure Laterality Date  . BACK SURGERY    . ORTHOPEDIC SURGERY  2008   left wrist fracture with internal fixation        Home Medications    Prior to Admission medications   Medication Sig Start Date End Date Taking? Authorizing Provider  diphenoxylate-atropine (LOMOTIL) 2.5-0.025 MG tablet Take 1 tablet by mouth 4 (four) times daily as needed for diarrhea or loose stools. Patient not taking: Reported on 01/15/2018 05/24/16   Burgess Amor, PA-C  ibuprofen (ADVIL,MOTRIN) 600 MG tablet Take 1 tablet (600 mg total) by mouth every 6 (six) hours as needed. Patient not taking: Reported on 01/15/2018 11/24/17   Elpidio Anis, PA-C  metroNIDAZOLE (FLAGYL) 500 MG tablet Take 1 tablet (500 mg total) by mouth 2 (two) times daily. Patient not taking: Reported on 01/15/2018 05/24/16   Burgess Amor, PA-C    Family History Family History  Problem Relation Age of Onset  . Stroke Father   . Hypertension Other   . Diabetes Other   . Cancer Other     Social History Social History   Tobacco Use  . Smoking status: Current Every Day  Smoker    Packs/day: 0.25    Types: Cigarettes  . Smokeless tobacco: Never Used  Substance Use Topics  . Alcohol use: Not Currently    Comment: 2-3 times a week  . Drug use: Not Currently    Types: Marijuana     Allergies   Shellfish allergy   Review of Systems Review of Systems  Musculoskeletal: Positive for joint swelling.  All other systems reviewed and are negative.    Physical Exam Updated Vital Signs BP 134/74 (BP Location: Left Arm)   Pulse 87   Temp 98.8 F (37.1 C) (Oral)   Resp 16   Ht 6' (1.829 m)   Wt 79.4 kg   SpO2 98%   BMI 23.73 kg/m   Physical Exam Vitals signs reviewed.  Musculoskeletal: Normal range of motion.        General: Swelling and tenderness present.     Comments: Deformity at dip,  Pt unable to fully extend,  Tip remains flexed,  nv and ns intact   Skin:    General: Skin is warm.  Neurological:     General: No focal deficit present.     Mental Status: He is alert.  Psychiatric:        Mood and Affect: Mood normal.      ED Treatments / Results  Labs (all labs ordered are listed, but only abnormal results are displayed) Labs  Reviewed - No data to display  EKG None  Radiology Dg Finger Little Right  Result Date: 10/14/2018 CLINICAL DATA:  Trauma to the right fifth digit yesterday. EXAM: RIGHT LITTLE FINGER 2+V COMPARISON:  None. FINDINGS: There is no evidence of fracture or dislocation. There is no evidence of arthropathy or other focal bone abnormality. Soft tissues are unremarkable. IMPRESSION: Negative. Electronically Signed   By: Sherian ReinWei-Chen  Lin M.D.   On: 10/14/2018 12:47    Procedures Procedures (including critical care time)  Medications Ordered in ED Medications - No data to display   Initial Impression / Assessment and Plan / ED Course  I have reviewed the triage vital signs and the nursing notes.  Pertinent labs & imaging results that were available during my care of the patient were reviewed by me and  considered in my medical decision making (see chart for details).        MDM  Xray reviewed and discussed with pt.  No fx or dislocation.  Pt has a Mallet finger deformity.   Pt placed in a splint.  He is advised to follow up with Dr. Romeo AppleHarrison for recheck   Final Clinical Impressions(s) / ED Diagnoses   Final diagnoses:  Mallet deformity of little finger  Fingernail injury, right, initial encounter    ED Discharge Orders    None    An After Visit Summary was printed and given to the patient.    Elson AreasSofia,  K, PA-C 10/14/18 1320    Samuel JesterMcManus, Kathleen, [place]DO 10/17/18 1646

## 2018-10-14 NOTE — ED Triage Notes (Signed)
Patient reports shutting a window on his R 5th finger last night. Unable to bend the finger. Ecchymosis and edema noted.

## 2018-12-01 ENCOUNTER — Emergency Department (HOSPITAL_COMMUNITY): Payer: Self-pay

## 2018-12-01 ENCOUNTER — Emergency Department (HOSPITAL_COMMUNITY)
Admission: EM | Admit: 2018-12-01 | Discharge: 2018-12-01 | Disposition: A | Discharge status: Home / Self Care | Payer: Self-pay | Attending: Emergency Medicine | Admitting: Emergency Medicine

## 2018-12-01 ENCOUNTER — Encounter (HOSPITAL_COMMUNITY): Payer: Self-pay | Admitting: Emergency Medicine

## 2018-12-01 ENCOUNTER — Other Ambulatory Visit: Payer: Self-pay

## 2018-12-01 DIAGNOSIS — Y939 Activity, unspecified: Secondary | ICD-10-CM | POA: Insufficient documentation

## 2018-12-01 DIAGNOSIS — W19XXXA Unspecified fall, initial encounter: Secondary | ICD-10-CM | POA: Insufficient documentation

## 2018-12-01 DIAGNOSIS — S93402A Sprain of unspecified ligament of left ankle, initial encounter: Secondary | ICD-10-CM | POA: Insufficient documentation

## 2018-12-01 DIAGNOSIS — F1210 Cannabis abuse, uncomplicated: Secondary | ICD-10-CM | POA: Insufficient documentation

## 2018-12-01 DIAGNOSIS — Y929 Unspecified place or not applicable: Secondary | ICD-10-CM | POA: Insufficient documentation

## 2018-12-01 DIAGNOSIS — S90411A Abrasion, right great toe, initial encounter: Secondary | ICD-10-CM | POA: Insufficient documentation

## 2018-12-01 DIAGNOSIS — Y998 Other external cause status: Secondary | ICD-10-CM | POA: Insufficient documentation

## 2018-12-01 DIAGNOSIS — F17210 Nicotine dependence, cigarettes, uncomplicated: Secondary | ICD-10-CM | POA: Insufficient documentation

## 2018-12-01 MED ORDER — IBUPROFEN 600 MG PO TABS: 600.0000 mg | ORAL_TABLET | Freq: Three times a day (TID) | ORAL | 0 refills | Status: DC

## 2018-12-01 NOTE — Discharge Instructions (Signed)
Clean the abrasion on your toe with mild soap and water.  Keep it bandaged.  Elevate and apply ice packs on and off to your ankle.  Avoid heat.  Use the crutches as needed for weightbearing.  You may contact the orthopedic provider listed to arrange a follow-up appointment in 1 week if not improving.

## 2018-12-01 NOTE — ED Triage Notes (Signed)
Pain to LT ankle, foot and lower leg since fall this morning.

## 2018-12-01 NOTE — ED Provider Notes (Signed)
[facility] Provider Note   CSN: [id number] Arrival date & time: 12/01/18  1204     History   Chief Complaint Chief Complaint  Patient presents with  . Ankle Pain    HPI Fernando Mccormick is a 28 y.o. male.     HPI   Fernando Mccormick is a 28 y.o. male who presents to the Emergency Department complaining of pain and swelling of his lateral left ankle and dorsal foot.  He describes a inversion injury of his ankle that occurred earlier this morning.  He reports immediate pain and swelling of the lateral ankle that has worsened.  He is having difficulty with weightbearing or movement of his ankle.  He denies other injuries, numbness of the extremity, neck or back pain, head injury or LOC.   Past Medical History:  Diagnosis Date  . ADHD (attention deficit hyperactivity disorder)   . Bipolar 1 disorder ([facility])     There are no active problems to display for this patient.   Past Surgical History:  Procedure Laterality Date  . BACK SURGERY    . ORTHOPEDIC SURGERY  2008   left wrist fracture with internal fixation      Home Medications    Prior to Admission medications   Medication Sig Start Date End Date Taking? Authorizing Provider  diphenoxylate-atropine (LOMOTIL) 2.5-0.025 MG tablet Take 1 tablet by mouth 4 (four) times daily as needed for diarrhea or loose stools. Patient not taking: Reported on 01/15/2018 05/24/16   Evalee Jefferson, PA-C  ibuprofen (ADVIL,MOTRIN) 600 MG tablet Take 1 tablet (600 mg total) by mouth every 6 (six) hours as needed. Patient not taking: Reported on 01/15/2018 11/24/17   Charlann Lange, PA-C  metroNIDAZOLE (FLAGYL) 500 MG tablet Take 1 tablet (500 mg total) by mouth 2 (two) times daily. Patient not taking: Reported on 01/15/2018 05/24/16   Evalee Jefferson, PA-C    Family History Family History  Problem Relation Age of Onset  . Stroke Father   . Hypertension Other   . Diabetes Other   . Cancer Other     Social History Social History    Tobacco Use  . Smoking status: Current Every Day Smoker    Packs/day: 0.25    Types: Cigarettes  . Smokeless tobacco: Never Used  Substance Use Topics  . Alcohol use: Not Currently    Comment: 2-3 times a week  . Drug use: Yes    Types: Marijuana     Allergies   Shellfish allergy   Review of Systems Review of Systems  Constitutional: Negative for chills and fever.  Respiratory: Negative for shortness of breath.   Cardiovascular: Negative for chest pain.  Gastrointestinal: Negative for abdominal pain, nausea and vomiting.  Musculoskeletal: Positive for arthralgias (Left lateral ankle pain and swelling) and joint swelling.  Skin: Positive for wound (Abrasion right great toe). Negative for color change.  Neurological: Negative for dizziness, syncope, weakness and numbness.     Physical Exam Updated Vital Signs BP 115/85   Pulse 89   Temp 98.2 F (36.8 C)   Resp 20   Ht 5\' 11"  (1.803 m)   Wt 78 kg   SpO2 97%   BMI 23.98 kg/m   Physical Exam Vitals signs and nursing note reviewed.  Constitutional:      Appearance: Normal appearance. He is not ill-appearing.  HENT:     Head: Atraumatic.  Neck:     Musculoskeletal: Normal range of motion.  Cardiovascular:     Rate  and Rhythm: Normal rate and regular rhythm.     Pulses: Normal pulses.  Pulmonary:     Effort: Pulmonary effort is normal.  Chest:     Chest wall: No tenderness.  Musculoskeletal:     Left ankle: He exhibits swelling. He exhibits no deformity and normal pulse. Tenderness. Lateral malleolus tenderness found. No head of 5th metatarsal tenderness found. Achilles tendon normal.     Comments: Tenderness palpation of the lateral left malleolus.  No bony deformity.  Pain through range of motion.  No obvious ligament instability.  No proximal tenderness.  Compartments are soft.  Skin:    General: Skin is warm.     Comments: Small superficial abrasion to the dorsal aspect of the right great toe.  No injury  of the nail.  No edema  Neurological:     Mental Status: He is alert.      ED Treatments / Results  Labs (all labs ordered are listed, but only abnormal results are displayed) Labs Reviewed - No data to display  EKG None  Radiology Dg Tibia/fibula Left  Result Date: 12/01/2018 CLINICAL DATA:  Pain following fall EXAM: LEFT TIBIA AND FIBULA - 2 VIEW COMPARISON:  None. FINDINGS: Frontal and lateral views obtained. No fracture or dislocation. No abnormal periosteal reaction. Joint spaces appear normal. IMPRESSION: No fracture or dislocation.  No evident arthropathy. Electronically Signed   By: Bretta BangWilliam  Woodruff III M.D.   On: 12/01/2018 12:55   Dg Ankle Complete Left  Result Date: 12/01/2018 CLINICAL DATA:  Pain following fall EXAM: LEFT ANKLE COMPLETE - 3+ VIEW COMPARISON:  None. FINDINGS: Frontal, oblique, and lateral views obtained. There is no demonstrable fracture or joint effusion. There is no joint space narrowing or erosion. Ankle mortise appears intact. IMPRESSION: No evident fracture or arthropathy.  Ankle mortise appears intact. Electronically Signed   By: Bretta BangWilliam  Woodruff III M.D.   On: 12/01/2018 12:56   Dg Foot Complete Left  Result Date: 12/01/2018 CLINICAL DATA:  Pain following fall EXAM: LEFT FOOT - COMPLETE 3+ VIEW COMPARISON:  None. FINDINGS: Frontal, oblique, and lateral views obtained. No evident fracture or dislocation. Joint spaces appear normal. No erosive change. IMPRESSION: No fracture or dislocation.  No evident arthropathy. Electronically Signed   By: Bretta BangWilliam  Woodruff III M.D.   On: 12/01/2018 12:55    Procedures Procedures (including critical care time)  Medications Ordered in ED Medications - No data to display   Initial Impression / Assessment and Plan / ED Course  I have reviewed the triage vital signs and the nursing notes.  Pertinent labs & imaging results that were available during my care of the patient were reviewed by me and considered in my  medical decision making (see chart for details).       Patient with inversion injury of the ankle.  X-rays are reassuring.  Neurovascularly intact.  Compartments are soft.  Patient with likely sprain. ASO brace and crutches applied.  Patient agrees to RICE therapy and close orthopedic follow-up.  Mild abrasion to right great toe was cleaned and bandaged by nursing staff.  TD up-to-date.   Final Clinical Impressions(s) / ED Diagnoses   Final diagnoses:  Sprain of left ankle, unspecified ligament, initial encounter  Abrasion of right great toe, initial encounter    ED Discharge Orders    None       Pauline Ausriplett, Alaja Goldinger, PA-C 12/01/18 1424    Donnetta Hutchingook, Brian, MD 12/02/18 [id number]

## 2019-01-23 ENCOUNTER — Emergency Department (HOSPITAL_COMMUNITY): Admission: EM | Admit: 2019-01-23 | Discharge: 2019-01-24 | Discharge status: Against Medical Advice | Payer: Self-pay

## 2019-01-23 ENCOUNTER — Other Ambulatory Visit: Payer: Self-pay

## 2019-01-24 ENCOUNTER — Emergency Department (HOSPITAL_COMMUNITY): Admission: EM | Admit: 2019-01-24 | Discharge: 2019-01-24 | Discharge status: Absent without leave | Payer: Self-pay

## 2019-01-25 ENCOUNTER — Encounter (HOSPITAL_COMMUNITY): Payer: Self-pay | Admitting: Emergency Medicine

## 2019-01-25 ENCOUNTER — Emergency Department (HOSPITAL_COMMUNITY)
Admission: EM | Admit: 2019-01-25 | Discharge: 2019-01-25 | Disposition: A | Discharge status: Home / Self Care | Payer: Self-pay | Attending: Emergency Medicine | Admitting: Emergency Medicine

## 2019-01-25 ENCOUNTER — Other Ambulatory Visit: Payer: Self-pay

## 2019-01-25 DIAGNOSIS — Z79899 Other long term (current) drug therapy: Secondary | ICD-10-CM | POA: Insufficient documentation

## 2019-01-25 DIAGNOSIS — Y999 Unspecified external cause status: Secondary | ICD-10-CM | POA: Insufficient documentation

## 2019-01-25 DIAGNOSIS — W1781XA Fall down embankment (hill), initial encounter: Secondary | ICD-10-CM | POA: Insufficient documentation

## 2019-01-25 DIAGNOSIS — R55 Syncope and collapse: Secondary | ICD-10-CM | POA: Insufficient documentation

## 2019-01-25 DIAGNOSIS — Y9301 Activity, walking, marching and hiking: Secondary | ICD-10-CM | POA: Insufficient documentation

## 2019-01-25 DIAGNOSIS — S01112A Laceration without foreign body of left eyelid and periocular area, initial encounter: Secondary | ICD-10-CM | POA: Insufficient documentation

## 2019-01-25 DIAGNOSIS — S0181XA Laceration without foreign body of other part of head, initial encounter: Secondary | ICD-10-CM

## 2019-01-25 DIAGNOSIS — F17210 Nicotine dependence, cigarettes, uncomplicated: Secondary | ICD-10-CM | POA: Insufficient documentation

## 2019-01-25 DIAGNOSIS — S0990XA Unspecified injury of head, initial encounter: Secondary | ICD-10-CM

## 2019-01-25 DIAGNOSIS — Y9289 Other specified places as the place of occurrence of the external cause: Secondary | ICD-10-CM | POA: Insufficient documentation

## 2019-01-25 DIAGNOSIS — S01412A Laceration without foreign body of left cheek and temporomandibular area, initial encounter: Secondary | ICD-10-CM | POA: Insufficient documentation

## 2019-01-25 MED ORDER — LIDOCAINE-EPINEPHRINE (PF) 2 %-1:200000 IJ SOLN
5.0000 mL | Freq: Once | INTRAMUSCULAR | Status: AC
  Administered 2019-01-25: 5 mL
  Filled 2019-01-25: qty 10

## 2019-01-25 MED ORDER — IBUPROFEN 400 MG PO TABS
600.0000 mg | ORAL_TABLET | Freq: Once | ORAL | Status: AC
  Administered 2019-01-25: 20:00:00 600 mg via ORAL
  Filled 2019-01-25: qty 2

## 2019-01-25 NOTE — ED Provider Notes (Signed)
[facility]ENC[facility] DEPART[facility] Provider Note   CSN: [id number] Arrival date & time: 01/25/19  1810     History   Chief Complaint Chief Complaint  Patient presents with  . Laceration    HPI Fernando Mccormick is a 28 y.o. male with past medical history of ADHD, bipolar 1 disorder, presenting to the emergency department with facial laceration and injury.  He states he was walking down a hill and tripped and ran into a pole.  He hit his left face on the pole and had brief loss of consciousness.  He endorses a pain to his left face where he has lacerations to his brow and cheek.  He denies global headache, vision changes, nausea, vomiting, neck pain.  Tetanus vaccine was updated last year.  He denies immunocompromise. Not on anticoagulation.     The history is provided by the patient.    Past Medical History:  Diagnosis Date  . ADHD (attention deficit hyperactivity disorder)   . Bipolar 1 disorder ([facility])     There are no active problems to display for this patient.   Past Surgical History:  Procedure Laterality Date  . BACK SURGERY    . ORTHOPEDIC SURGERY  2008   left wrist fracture with internal fixation        Home Medications    Prior to Admission medications   Medication Sig Start Date End Date Taking? Authorizing Provider  diphenoxylate-atropine (LOMOTIL) 2.5-0.025 MG tablet Take 1 tablet by mouth 4 (four) times daily as needed for diarrhea or loose stools. Patient not taking: Reported on 01/15/2018 05/24/16   Burgess AmorIdol, Julie, PA-C  ibuprofen (ADVIL) 600 MG tablet Take 1 tablet (600 mg total) by mouth 3 (three) times daily. 12/01/18   Triplett, Tammy, PA-C  metroNIDAZOLE (FLAGYL) 500 MG tablet Take 1 tablet (500 mg total) by mouth 2 (two) times daily. Patient not taking: Reported on 01/15/2018 05/24/16   Burgess AmorIdol, Julie, PA-C    Family History Family History  Problem Relation Age of Onset  . Stroke Father   . Hypertension Other   . Diabetes Other   . Cancer Other      Social History Social History   Tobacco Use  . Smoking status: Current Every Day Smoker    Packs/day: 0.25    Types: Cigarettes  . Smokeless tobacco: Never Used  Substance Use Topics  . Alcohol use: Not Currently    Comment: 2-3 times a week  . Drug use: Not Currently    Types: Marijuana     Allergies   Shellfish allergy   Review of Systems Review of Systems  Eyes: Negative for photophobia and visual disturbance.  Gastrointestinal: Negative for nausea and vomiting.  Musculoskeletal: Negative for neck pain.  Skin: Positive for wound.  Neurological: Positive for syncope. Negative for numbness and headaches.  All other systems reviewed and are negative.    Physical Exam Updated Vital Signs BP 126/69   Pulse 69   Temp 98.9 F (37.2 C) (Oral)   Resp 16   Ht 6' (1.829 m)   Wt 80.7 kg   SpO2 98%   BMI 24.14 kg/m   Physical Exam Vitals signs and nursing note reviewed.  Constitutional:      General: He is not in acute distress.    Appearance: He is well-developed.  HENT:     Head: Normocephalic.     Comments: There are 2 lacerations to patient's left face, he has a 2cm centimeter jagged/T shaped laceration to the left lateral  brow.  Does not appear grossly contaminated.  Hemostasis achieved with direct pressure.  There is no surrounding bony tenderness or deformity. The left cheek has a 1 cm linear laceration that does not appear grossly contaminated.  Wounds have no obvious foreign body. There is no periorbital swelling or bruising.  No subconjunctival hemorrhage or obvious hyphema.  Eyes:     Extraocular Movements: Extraocular movements intact.     Conjunctiva/sclera: Conjunctivae normal.     Pupils: Pupils are equal, round, and reactive to light.  Neck:     Musculoskeletal: Normal range of motion and neck supple. No muscular tenderness.  Cardiovascular:     Rate and Rhythm: Normal rate and regular rhythm.  Pulmonary:     Effort: Pulmonary effort is normal.  No respiratory distress.     Breath sounds: Normal breath sounds.  Abdominal:     Palpations: Abdomen is soft.  Skin:    General: Skin is warm.  Neurological:     Mental Status: He is alert.     Comments: Mental Status:  Alert, oriented, thought content appropriate, able to give a coherent history. Speech fluent without evidence of aphasia. Able to follow 2 step commands without difficulty.  Cranial Nerves: Grossly intact Motor:  Normal tone. 5/5 strength in upper and lower extremities bilaterally including strong and equal grip strength and dorsiflexion/plantar flexion Sensory: grossly normal in all extremities.  Cerebellar: normal finger-to-nose with bilateral upper extremities Gait: normal gait and balance CV: distal pulses palpable throughout    Psychiatric:        Behavior: Behavior normal.      ED Treatments / Results  Labs (all labs ordered are listed, but only abnormal results are displayed) Labs Reviewed - No data to display  EKG None  Radiology No results found.  Procedures .Marland KitchenLaceration Repair  Date/Time: 01/25/2019 9:24 PM Performed by: Miriam Liles, [place] N, PA-C Authorized by: Antha Niday, [place] N, PA-C   Consent:    Consent obtained:  Verbal   Consent given by:  Patient   Risks discussed:  Infection, pain and poor cosmetic result   Alternatives discussed:  No treatment Anesthesia (see MAR for exact dosages):    Anesthesia method:  Local infiltration   Local anesthetic:  Lidocaine 2% WITH epi Laceration details:    Location:  Face   Face location:  L eyebrow   Length (cm):  2 Repair type:    Repair type:  Simple Pre-procedure details:    Preparation:  Patient was prepped and draped in usual sterile fashion Exploration:    Hemostasis achieved with:  Direct pressure   Wound exploration: wound explored through full range of motion and entire depth of wound probed and visualized     Wound extent: no foreign bodies/material noted and no underlying  fracture noted     Contaminated: no   Treatment:    Area cleansed with:  Saline   Amount of cleaning:  Standard   Visualized foreign bodies/material removed: no   Skin repair:    Repair method:  Sutures   Suture size:  6-0   Suture material:  Prolene   Suture technique:  Simple interrupted   Number of sutures:  4 Approximation:    Approximation:  Close Post-procedure details:    Dressing:  Open (no dressing)   Patient tolerance of procedure:  Tolerated well, no immediate complications .Marland KitchenLaceration Repair  Date/Time: 01/25/2019 9:25 PM Performed by: Kurk Corniel, [place] N, PA-C Authorized by: Shalayah Beagley, [place] N, PA-C   Consent:  Consent obtained:  Verbal   Consent given by:  Patient   Risks discussed:  Infection, pain and poor cosmetic result   Alternatives discussed:  No treatment Anesthesia (see MAR for exact dosages):    Anesthesia method:  Local infiltration   Local anesthetic:  Lidocaine 2% WITH epi Laceration details:    Location:  Face   Face location:  L cheek   Length (cm):  1 Repair type:    Repair type:  Simple Pre-procedure details:    Preparation:  Patient was prepped and draped in usual sterile fashion Exploration:    Hemostasis achieved with:  Direct pressure   Wound exploration: wound explored through full range of motion and entire depth of wound probed and visualized     Wound extent: no foreign bodies/material noted and no underlying fracture noted     Contaminated: no   Treatment:    Area cleansed with:  Saline   Amount of cleaning:  Standard   Visualized foreign bodies/material removed: no   Skin repair:    Repair method:  Sutures   Suture size:  6-0   Suture material:  Prolene   Suture technique:  Simple interrupted   Number of sutures:  2 Approximation:    Approximation:  Close Post-procedure details:    Dressing:  Open (no dressing)   Patient tolerance of procedure:  Tolerated well, no immediate complications   (including critical care  time)  Medications Ordered in ED Medications  ibuprofen (ADVIL) tablet 600 mg (600 mg Oral Given 01/25/19 1937)  lidocaine-EPINEPHrine (XYLOCAINE W/EPI) 2 %-1:200000 (PF) injection 5 mL (5 mLs Infiltration Given 01/25/19 2051)     Initial Impression / Assessment and Plan / ED Course  I have reviewed the triage vital signs and the nursing notes.  Pertinent labs & imaging results that were available during my care of the patient were reviewed by me and considered in my medical decision making (see chart for details).       Pt with lacerations to left face that occurred after he tripped down a hill and hit a pole just prior to arrival.  Low suspicion for facial bone fractures given reassuring exam.  No focal neuro deficits.  No vomiting, severe headache, vision changes, or other concerning signs or symptoms that would indicate needfor advanced imaging today.  Possibility of minor concussion.  Patient is not on blood thinners.  Wounds explored and base of wound visualized in a bloodless field without evidence of foreign body. Laceration occurred < 8 hours prior to repair which was well tolerated.Tdap up-to-date.  Pt has  no comorbidities to effect normal wound healing. Pt discharged  without antibiotics.  Discussed suture home care with patient and answered questions. Pt to follow-up for wound check and suture removal in 5-7 days; they are to return to the ED sooner for signs of infection.  Patient is also instructed of concussion precautions and strict return precautions.  He is instructed to avoid any contact sports or activities until he is completely symptom-free.  Pt is hemodynamically stable and agreeable with care plan and discharge.   Discussed results, findings, treatment and follow up. Patient advised of return precautions. Patient verbalized understanding and agreed with plan.  Final Clinical Impressions(s) / ED Diagnoses   Final diagnoses:  Facial laceration, initial encounter  Minor  head injury, initial encounter    ED Discharge Orders    None       Cresencio Reesor, [place] N, PA-C 01/25/19 2127    Lacinda Axon,  Arlys JohnBrian, MD 01/27/19 1429

## 2019-01-25 NOTE — Discharge Instructions (Signed)
Please read instructions below.  Keep your wound clean and covered. In 24 hours, you can get your wound wet; gently clean it with soap and water daily.  Do not scrub or soak your wounds. You can take ibuprofen/advil every 6 hours as needed for pain. Apply ice to your face for 20 minutes at a time to help with pain and swelling. Stay hydrated and get plenty of rest. Limit your screen time and complex thinking. Avoid any contact sports/activities to prevent re-injury to your head. Follow up with your primary care or urgent care for wound recheck in 5-7 days.  Return to the ER for fever, pus draining from wound, redness, severely worsening headache, changes in your vision, persistent vomiting, or new or concerning symptoms.
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
## 2022-09-24 ENCOUNTER — Other Ambulatory Visit: Payer: Self-pay

## 2022-09-24 ENCOUNTER — Encounter (HOSPITAL_COMMUNITY): Payer: Self-pay

## 2022-09-24 ENCOUNTER — Emergency Department (HOSPITAL_COMMUNITY)
Admission: EM | Admit: 2022-09-24 | Discharge: 2022-09-24 | Disposition: A | Discharge status: Home / Self Care | Payer: Medicaid Other | Attending: Emergency Medicine | Admitting: Emergency Medicine

## 2022-09-24 DIAGNOSIS — Z202 Contact with and (suspected) exposure to infections with a predominantly sexual mode of transmission: Secondary | ICD-10-CM | POA: Insufficient documentation

## 2022-09-24 DIAGNOSIS — R360 Urethral discharge without blood: Secondary | ICD-10-CM | POA: Insufficient documentation

## 2022-09-24 DIAGNOSIS — R300 Dysuria: Secondary | ICD-10-CM | POA: Insufficient documentation

## 2022-09-24 LAB — URINALYSIS, ROUTINE W REFLEX MICROSCOPIC
Bilirubin Urine: NEGATIVE
Glucose, UA: NEGATIVE mg/dL
Hgb urine dipstick: NEGATIVE
Ketones, ur: NEGATIVE mg/dL
Nitrite: NEGATIVE
Protein, ur: 30 mg/dL — AB
Specific Gravity, Urine: 1.031 — ABNORMAL HIGH (ref 1.005–1.030)
WBC, UA: >50 WBC/hpf (ref 0–5)
pH: 6 (ref 5.0–8.0)

## 2022-09-24 LAB — HIV ANTIBODY (ROUTINE TESTING W REFLEX): HIV Screen 4th Generation wRfx: NONREACTIVE

## 2022-09-24 MED ORDER — DOXYCYCLINE HYCLATE 100 MG PO CAPS: 100.0000 mg | ORAL_CAPSULE | Freq: Two times a day (BID) | ORAL | 0 refills | Status: DC

## 2022-09-24 MED ORDER — CEFTRIAXONE SODIUM 500 MG IJ SOLR
500.0000 mg | Freq: Once | INTRAMUSCULAR | Status: AC
  Administered 2022-09-24: 500 mg via INTRAMUSCULAR
  Filled 2022-09-24: qty 500

## 2022-09-24 MED ORDER — LIDOCAINE HCL (PF) 1 % IJ SOLN
1.0000 mL | Freq: Once | INTRAMUSCULAR | Status: AC
  Administered 2022-09-24: 1.2 mL
  Filled 2022-09-24: qty 5

## 2022-09-24 NOTE — ED Provider Notes (Signed)
[place] EMERGENCY DEPARTMENT AT [facility] Provider Note   CSN: [id number] Arrival date & time: 09/24/22  1711     History  Chief Complaint  Patient presents with   Exposure to STD    Fernando Mccormick is a 32 y.o. male.  Patient presents to the emergency department complaining of dysuria, white penile discharge.  He states symptoms have been ongoing for approximately 1 week.  He endorses sexual activity with a new sexual partner.  Past medical history significant for ADHD, bipolar 1 disorder  HPI     Home Medications Prior to Admission medications   Medication Sig Start Date End Date Taking? Authorizing Provider  doxycycline (VIBRAMYCIN) 100 MG capsule Take 1 capsule (100 mg total) by mouth 2 (two) times daily. 09/24/22  Yes Darrick Grinder, PA-C  diphenoxylate-atropine (LOMOTIL) 2.5-0.025 MG tablet Take 1 tablet by mouth 4 (four) times daily as needed for diarrhea or loose stools. Patient not taking: Reported on 01/15/2018 05/24/16   Burgess Amor, PA-C  ibuprofen (ADVIL) 600 MG tablet Take 1 tablet (600 mg total) by mouth 3 (three) times daily. 12/01/18   Triplett, Tammy, PA-C  metroNIDAZOLE (FLAGYL) 500 MG tablet Take 1 tablet (500 mg total) by mouth 2 (two) times daily. Patient not taking: Reported on 01/15/2018 05/24/16   Burgess Amor, PA-C      Allergies    Shellfish allergy    Review of Systems   Review of Systems  Physical Exam Updated Vital Signs BP (!) 143/84 (BP Location: Right Arm)   Pulse (!) 108   Temp 98.5 F (36.9 C) (Oral)   Resp 19   Ht 6' (1.829 m)   Wt 83.9 kg   SpO2 97%   BMI 25.09 kg/m  Physical Exam Vitals and nursing note reviewed.  Constitutional:      General: He is not in acute distress.    Appearance: He is well-developed.  HENT:     Head: Normocephalic and atraumatic.  Eyes:     Conjunctiva/sclera: Conjunctivae normal.  Cardiovascular:     Rate and Rhythm: Normal rate and regular rhythm.     Heart sounds: No murmur  heard. Pulmonary:     Effort: Pulmonary effort is normal. No respiratory distress.     Breath sounds: Normal breath sounds.  Abdominal:     Palpations: Abdomen is soft.     Tenderness: There is no abdominal tenderness.  Genitourinary:    Comments: Deferred Musculoskeletal:        General: No swelling.     Cervical back: Neck supple.  Skin:    General: Skin is warm and dry.     Capillary Refill: Capillary refill takes less than 2 seconds.  Neurological:     Mental Status: He is alert.  Psychiatric:        Mood and Affect: Mood normal.     ED Results / Procedures / Treatments   Labs (all labs ordered are listed, but only abnormal results are displayed) Labs Reviewed  URINALYSIS, ROUTINE W REFLEX MICROSCOPIC - Abnormal; Notable for the following components:      Result Value   APPearance HAZY (*)    Specific Gravity, Urine 1.031 (*)    Protein, ur 30 (*)    Leukocytes,Ua MODERATE (*)    Bacteria, UA RARE (*)    All other components within normal limits  HIV ANTIBODY (ROUTINE TESTING W REFLEX)  RPR  GC/CHLAMYDIA PROBE AMP ([place]) NOT AT [facility]    EKG None  Radiology No results found.  Procedures Procedures    Medications Ordered in ED Medications  cefTRIAXone (ROCEPHIN) injection 500 mg (500 mg Intramuscular Given 09/24/22 1801)  lidocaine (PF) (XYLOCAINE) 1 % injection 1-2.1 mL (1.2 mLs Other Given 09/24/22 1801)    ED Course/ Medical Decision Making/ A&P                             Medical Decision Making Amount and/or Complexity of Data Reviewed Labs: ordered.  Risk Prescription drug management.   Patient presents with chief complaint of dysuria and urethral discharge secondary to new sexual partner.  Differential diagnosis includes but is not limited to STI, urethritis, and others  I ordered and reviewed lab results.  Pertinent results include UA with moderate leukocytes and rare bacteria.  STI testing pending  I ordered the patient Rocephin  for STI coverage.  Plan to discharge home at this time with treatment for presumed gonorrhea/chlamydia infection.  Patient will monitor MyChart for results of testing.  If testing is negative patient may discontinue the use of the doxycycline.  Patient informed that he should avoid sexual contact until he is completed the entire course of antibiotics and that if test are positive, other partner should be notified for testing/treatment.  Discharge home.          Final Clinical Impression(s) / ED Diagnoses Final diagnoses:  Possible exposure to STD    Rx / DC Orders ED Discharge Orders          Ordered    doxycycline (VIBRAMYCIN) 100 MG capsule  2 times daily        09/24/22 1737              Pamala Duffel 09/24/22 1825    Eber Hong, MD 09/26/22 [phone]

## 2022-09-24 NOTE — Discharge Instructions (Signed)
You were evaluated today for possible exposure to STD.  You were treated for gonorrhea and chlamydia with shot here in the emergency department and outpatient antibiotic called doxycycline.  If the results of your gonorrhea chlamydia testing are negative you may stop taking the doxycycline.  If your HIV or syphilis testing which is positive you will need further treatment.  You should avoid any sexual contact until you complete the full course of antibiotics.  If your test returns positive all recent sexual partners should also be tested/treated.

## 2022-09-24 NOTE — ED Triage Notes (Signed)
Pt endorses white discharge from penis and burning with urination x 1 week. Pt also endorses new sexual contacts with no use of protection.   Pt A & O x 4, NAD in triage.

## 2022-09-25 LAB — GC/CHLAMYDIA PROBE AMP (~~LOC~~) NOT AT ARMC
Chlamydia: NEGATIVE
Comment: NEGATIVE
Comment: NORMAL
Neisseria Gonorrhea: POSITIVE — AB

## 2022-09-25 LAB — RPR: RPR Ser Ql: NONREACTIVE

## 2023-03-03 ENCOUNTER — Emergency Department (HOSPITAL_COMMUNITY)
Admission: EM | Admit: 2023-03-03 | Discharge: 2023-03-03 | Discharge status: Against Medical Advice | Payer: Medicaid Other | Attending: Emergency Medicine | Admitting: Emergency Medicine

## 2023-03-03 ENCOUNTER — Encounter (HOSPITAL_COMMUNITY): Payer: Self-pay

## 2023-03-03 ENCOUNTER — Other Ambulatory Visit: Payer: Self-pay

## 2023-03-03 DIAGNOSIS — R369 Urethral discharge, unspecified: Secondary | ICD-10-CM | POA: Diagnosis present

## 2023-03-03 DIAGNOSIS — R300 Dysuria: Secondary | ICD-10-CM | POA: Insufficient documentation

## 2023-03-03 DIAGNOSIS — Z5321 Procedure and treatment not carried out due to patient leaving prior to being seen by health care provider: Secondary | ICD-10-CM | POA: Insufficient documentation

## 2023-03-03 LAB — RPR: RPR Ser Ql: NONREACTIVE

## 2023-03-03 LAB — HIV ANTIBODY (ROUTINE TESTING W REFLEX): HIV Screen 4th Generation wRfx: NONREACTIVE

## 2023-03-03 NOTE — ED Triage Notes (Signed)
Pt c/o white penile discharge and dysuriax1-2d.

## 2023-03-03 NOTE — ED Notes (Signed)
Pt stated "pt has to leave due to girlfriend having to pick up kids". Pt seen leaving the ED.

## 2023-04-21 ENCOUNTER — Encounter (HOSPITAL_COMMUNITY): Payer: Self-pay

## 2023-04-21 ENCOUNTER — Ambulatory Visit (HOSPITAL_COMMUNITY)
Admission: EM | Admit: 2023-04-21 | Discharge: 2023-04-21 | Disposition: A | Discharge status: Home / Self Care | Payer: Medicaid Other | Attending: Family Medicine | Admitting: Family Medicine

## 2023-04-21 DIAGNOSIS — Z202 Contact with and (suspected) exposure to infections with a predominantly sexual mode of transmission: Secondary | ICD-10-CM | POA: Insufficient documentation

## 2023-04-21 LAB — HIV ANTIBODY (ROUTINE TESTING W REFLEX): HIV Screen 4th Generation wRfx: NONREACTIVE

## 2023-04-21 NOTE — Discharge Instructions (Signed)
Staff will notify you if there is anything positive on the swab or on the blood work (tests for HIV and syphilis)

## 2023-04-21 NOTE — ED Triage Notes (Signed)
Pt states his girl expose him to Chlamydia. Pt reports penile discharge and would STD-testing.

## 2023-04-21 NOTE — ED Provider Notes (Signed)
[facility]    CSN: [id number] Arrival date & time: 04/21/23  1322      History   Chief Complaint Chief Complaint  Patient presents with   Exposure to STD   Shoulder Pain    HPI Fernando Mccormick is a 32 y.o. male.   Here for exposure to chlamydia.  His girlfriend told him that she is tested positive for chlamydia.  They have not consistently been using condoms.  Gay Filler understood him to say that he was having some penile discharge, but to me he denies this symptom.  He is also not had any itching.  He currently has no dysuria, though he states that very briefly 1 day a couple weeks ago he did have some dysuria.  No fever or abdominal pain  No allergies to medications  He wishes to be retested for RPR and HIV also  Past Medical History:  Diagnosis Date   ADHD (attention deficit hyperactivity disorder)    Bipolar 1 disorder (HCC)     There are no problems to display for this patient.   Past Surgical History:  Procedure Laterality Date   BACK SURGERY     ORTHOPEDIC SURGERY  2008   left wrist fracture with internal fixation       Home Medications    Prior to Admission medications   Medication Sig Start Date End Date Taking? Authorizing Provider  methylphenidate (RITALIN) 20 MG tablet Take 20 mg by mouth daily. 11/07/22  Yes [provider]  traZODone (DESYREL) 50 MG tablet Take 50 mg by mouth at bedtime.    [provider]    Family History Family History  Problem Relation Age of Onset   Stroke Father    Hypertension Other    Diabetes Other    Cancer Other     Social History Social History   Tobacco Use   Smoking status: Every Day    Current packs/day: 0.25    Types: Cigarettes   Smokeless tobacco: Never  Vaping Use   Vaping status: Never Used  Substance Use Topics   Alcohol use: Yes    Comment: 2-3 times a week   Drug use: Not Currently    Types: Marijuana     Allergies   Shellfish allergy   Review of  Systems Review of Systems   Physical Exam Triage Vital Signs ED Triage Vitals [04/21/23 1506]  Encounter Vitals Group     BP 124/78     Systolic BP Percentile      Diastolic BP Percentile      Pulse Rate 95     Resp 16     Temp 97.8 F (36.6 C)     Temp Source Oral     SpO2 96 %     Weight      Height      Head Circumference      Peak Flow      Pain Score      Pain Loc      Pain Education      Exclude from Growth Chart    No data found.  Updated Vital Signs BP 124/78 (BP Location: Left Arm)   Pulse 95   Temp 97.8 F (36.6 C) (Oral)   Resp 16   SpO2 96%   Visual Acuity Right Eye Distance:   Left Eye Distance:   Bilateral Distance:    Right Eye Near:   Left Eye Near:    Bilateral Near:     Physical  Exam Vitals reviewed.  Constitutional:      General: He is not in acute distress.    Appearance: He is not ill-appearing, toxic-appearing or diaphoretic.  Skin:    Coloration: Skin is not jaundiced or pale.  Neurological:     Mental Status: He is alert and oriented to person, place, and time.  Psychiatric:        Behavior: Behavior normal.      UC Treatments / Results  Labs (all labs ordered are listed, but only abnormal results are displayed) Labs Reviewed  RPR  HIV ANTIBODY (ROUTINE TESTING W REFLEX)  CYTOLOGY, (ORAL, ANAL, URETHRAL) ANCILLARY ONLY    EKG   Radiology No results found.  Procedures Procedures (including critical care time)  Medications Ordered in UC Medications - No data to display  Initial Impression / Assessment and Plan / UC Course  I have reviewed the triage vital signs and the nursing notes.  Pertinent labs & imaging results that were available during my care of the patient were reviewed by me and considered in my medical decision making (see chart for details).     Urethral self swab is done and staff will notify him of any positives and treat per protocol. Blood is also drawn for HIV and RPR and we will also  notify him of any positives on the lab work. Final Clinical Impressions(s) / UC Diagnoses   Final diagnoses:  STD exposure     Discharge Instructions      Staff will notify you if there is anything positive on the swab or on the blood work (tests for HIV and syphilis)     ED Prescriptions   None    PDMP not reviewed this encounter.   Zenia Resides, MD 04/21/23 1535

## 2023-04-22 LAB — RPR: RPR Ser Ql: NONREACTIVE

## 2023-04-22 LAB — CYTOLOGY, (ORAL, ANAL, URETHRAL) ANCILLARY ONLY
Chlamydia: POSITIVE — AB
Comment: NEGATIVE
Comment: NEGATIVE
Comment: NORMAL
Neisseria Gonorrhea: NEGATIVE
Trichomonas: NEGATIVE

## 2023-04-23 ENCOUNTER — Telehealth: Payer: Self-pay

## 2023-04-23 MED ORDER — DOXYCYCLINE HYCLATE 100 MG PO CAPS: 100.0000 mg | ORAL_CAPSULE | Freq: Two times a day (BID) | ORAL | 0 refills | Status: DC

## 2023-04-23 NOTE — Telephone Encounter (Signed)
Pt requires tx with Doxycycline.  Attempted to reach patient x1. Unable to LVM.  Rx sent to pharmacy on file.

## 2023-05-05 ENCOUNTER — Telehealth (HOSPITAL_COMMUNITY): Payer: Self-pay | Admitting: *Deleted

## 2023-05-05 NOTE — Telephone Encounter (Signed)
Called pharmacy they have rx on file and are going to fill it now, pt advised.

## 2023-05-14 ENCOUNTER — Encounter (HOSPITAL_COMMUNITY): Payer: Self-pay

## 2023-05-14 ENCOUNTER — Other Ambulatory Visit: Payer: Self-pay

## 2023-05-14 ENCOUNTER — Emergency Department (HOSPITAL_COMMUNITY)
Admission: EM | Admit: 2023-05-14 | Discharge: 2023-05-14 | Discharge status: Against Medical Advice | Payer: Medicaid Other | Attending: Emergency Medicine | Admitting: Emergency Medicine

## 2023-05-14 DIAGNOSIS — Z5321 Procedure and treatment not carried out due to patient leaving prior to being seen by health care provider: Secondary | ICD-10-CM | POA: Insufficient documentation

## 2023-05-14 DIAGNOSIS — Z113 Encounter for screening for infections with a predominantly sexual mode of transmission: Secondary | ICD-10-CM | POA: Insufficient documentation

## 2023-05-14 NOTE — ED Triage Notes (Addendum)
Pt states he had chlamydia recently and would like to get checked to see if it went away. Pt also requesting full body CT scan and a referral to chiropractor. Pt also requesting to know blood type. Denies urinary issues.

## 2023-05-14 NOTE — ED Notes (Signed)
Pt stated they are leaving 

## 2023-06-25 ENCOUNTER — Encounter (HOSPITAL_COMMUNITY): Payer: Self-pay

## 2023-06-25 ENCOUNTER — Other Ambulatory Visit: Payer: Self-pay

## 2023-06-25 ENCOUNTER — Ambulatory Visit (HOSPITAL_COMMUNITY)
Admission: EM | Admit: 2023-06-25 | Discharge: 2023-06-25 | Disposition: A | Discharge status: Home / Self Care | Payer: Medicaid Other | Attending: Family Medicine | Admitting: Family Medicine

## 2023-06-25 ENCOUNTER — Emergency Department (HOSPITAL_COMMUNITY)
Admission: EM | Admit: 2023-06-25 | Discharge: 2023-06-25 | Disposition: A | Discharge status: Home / Self Care | Payer: Medicaid Other | Attending: Emergency Medicine | Admitting: Emergency Medicine

## 2023-06-25 ENCOUNTER — Emergency Department (HOSPITAL_COMMUNITY): Payer: Medicaid Other

## 2023-06-25 DIAGNOSIS — R509 Fever, unspecified: Secondary | ICD-10-CM | POA: Diagnosis present

## 2023-06-25 DIAGNOSIS — Z113 Encounter for screening for infections with a predominantly sexual mode of transmission: Secondary | ICD-10-CM | POA: Insufficient documentation

## 2023-06-25 DIAGNOSIS — Z20822 Contact with and (suspected) exposure to covid-19: Secondary | ICD-10-CM | POA: Insufficient documentation

## 2023-06-25 DIAGNOSIS — J069 Acute upper respiratory infection, unspecified: Secondary | ICD-10-CM | POA: Diagnosis not present

## 2023-06-25 LAB — RESP PANEL BY RT-PCR (RSV, FLU A&B, COVID)  RVPGX2
Influenza A by PCR: NEGATIVE
Influenza B by PCR: NEGATIVE
Resp Syncytial Virus by PCR: NEGATIVE
SARS Coronavirus 2 by RT PCR: NEGATIVE

## 2023-06-25 LAB — HIV ANTIBODY (ROUTINE TESTING W REFLEX): HIV Screen 4th Generation wRfx: NONREACTIVE

## 2023-06-25 MED ORDER — BENZONATATE 100 MG PO CAPS: 100.0000 mg | ORAL_CAPSULE | Freq: Three times a day (TID) | ORAL | 0 refills | Status: DC

## 2023-06-25 MED ORDER — ONDANSETRON 4 MG PO TBDP: ORAL_TABLET | ORAL | 0 refills | Status: DC

## 2023-06-25 NOTE — ED Provider Notes (Signed)
[facility] EMERGENCY DEPARTMENT AT [facility] Provider Note   CSN: [id number] Arrival date & time: 06/25/23  0715     History  Chief Complaint  Patient presents with   URI    Fernando Mccormick is a 33 y.o. male.  33 yo M with a cc of cough, congestion fevers.  Going on for about 48 hours. Nausea and vomiting.  Denies hx of asthma.  No difficulty breathing.    URI      Home Medications Prior to Admission medications   Medication Sig Start Date End Date Taking? Authorizing Provider  benzonatate (TESSALON) 100 MG capsule Take 1 capsule (100 mg total) by mouth every 8 (eight) hours. 06/25/23  Yes Melene Plan, DO  ondansetron (ZOFRAN-ODT) 4 MG disintegrating tablet 4mg  ODT q4 hours prn nausea/vomit 06/25/23  Yes Melene Plan, DO  doxycycline (VIBRAMYCIN) 100 MG capsule Take 1 capsule (100 mg total) by mouth 2 (two) times daily. 04/23/23   Merrilee Jansky, MD  methylphenidate (RITALIN) 20 MG tablet Take 20 mg by mouth daily. 11/07/22   [provider]  traZODone (DESYREL) 50 MG tablet Take 50 mg by mouth at bedtime.    [provider]      Allergies    Shellfish allergy    Review of Systems   Review of Systems  Physical Exam Updated Vital Signs BP 123/75 (BP Location: Right Arm)   Pulse 84   Temp 98.1 F (36.7 C) (Oral)   Resp 18   Ht 6' (1.829 m)   Wt 93 kg   SpO2 98%   BMI 27.80 kg/m  Physical Exam Vitals and nursing note reviewed.  Constitutional:      Appearance: He is well-developed.  HENT:     Head: Normocephalic and atraumatic.     Comments: Swollen turbinates, posterior nasal drip, tm normal bilaterally.   Eyes:     Pupils: Pupils are equal, round, and reactive to light.  Neck:     Vascular: No JVD.  Cardiovascular:     Rate and Rhythm: Normal rate and regular rhythm.     Heart sounds: No murmur heard.    No friction rub. No gallop.  Pulmonary:     Effort: No respiratory distress.     Breath sounds: No wheezing.  Abdominal:      General: There is no distension.     Tenderness: There is no abdominal tenderness. There is no guarding or rebound.  Musculoskeletal:        General: Normal range of motion.     Cervical back: Normal range of motion and neck supple.  Skin:    Coloration: Skin is not pale.     Findings: No rash.  Neurological:     Mental Status: He is alert and oriented to person, place, and time.  Psychiatric:        Behavior: Behavior normal.     ED Results / Procedures / Treatments   Labs (all labs ordered are listed, but only abnormal results are displayed) Labs Reviewed  RESP PANEL BY RT-PCR (RSV, FLU A&B, COVID)  [id number]    EKG None  Radiology DG Chest 2 View Result Date: 06/25/2023 CLINICAL DATA:  Cough. EXAM: CHEST - 2 VIEW COMPARISON:  01/15/2018. FINDINGS: Bilateral lung fields are clear. Bilateral costophrenic angles are clear. Normal cardio-mediastinal silhouette. No acute osseous abnormalities. The soft tissues are within normal limits. IMPRESSION: No active cardiopulmonary disease. Electronically Signed   By: Jules Schick M.D.   On:  06/25/2023 08:43    Procedures Procedures    Medications Ordered in ED Medications - No data to display  ED Course/ Medical Decision Making/ A&P                                 Medical Decision Making Risk Prescription drug management.   33 yo M with a cc of cough congestion fevers, chills myalgias.  Going on for a couple days.  Well appearing and non toxic.  No bacterial source found on exam.   CXR independently interpreted by me without focal infiltrate or ptx.    Covid flu rsv negative.  12:18 PM:  I have discussed the diagnosis/risks/treatment options with the patient.  Evaluation and diagnostic testing in the emergency department does not suggest an emergent condition requiring admission or immediate intervention beyond what has been performed at this time.  They will follow up with PCP. We also discussed returning to the ED  immediately if new or worsening sx occur. We discussed the sx which are most concerning (e.g., sudden worsening pain, fever, inability to tolerate by mouth) that necessitate immediate return. Medications administered to the patient during their visit and any new prescriptions provided to the patient are listed below.  Medications given during this visit Medications - No data to display   The patient appears reasonably screen and/or stabilized for discharge and I doubt any other medical condition or other [facility] requiring further screening, evaluation, or treatment in the ED at this time prior to discharge.          Final Clinical Impression(s) / ED Diagnoses Final diagnoses:  Viral upper respiratory tract infection    Rx / DC Orders ED Discharge Orders          Ordered    benzonatate (TESSALON) 100 MG capsule  Every 8 hours        06/25/23 1214    ondansetron (ZOFRAN-ODT) 4 MG disintegrating tablet        06/25/23 1214              Melene Plan, DO 06/25/23 1218

## 2023-06-25 NOTE — ED Triage Notes (Signed)
Patient presents to office for sti testing. Denies any  symptoms.

## 2023-06-25 NOTE — Discharge Instructions (Signed)
Take tylenol 2 pills 4 times a day and motrin 4 pills 3 times a day.  Drink plenty of fluids.  Return for worsening shortness of breath, headache, confusion. Follow up with your family doctor.

## 2023-06-25 NOTE — Discharge Instructions (Signed)
We have sent testing for sexually transmitted infections. We will notify you of any positive results once they are received. If required, we will prescribe any medications you might need.  Please refrain from all sexual activity for at least the next seven days.

## 2023-06-25 NOTE — ED Provider Triage Note (Signed)
Emergency Medicine Provider Triage Evaluation Note  Fernando Mccormick , a 33 y.o. male  was evaluated in triage.  Pt complains of myalgias, sore throat, dry cough, chills, vomiting, diarrhea x 2 days.  Review of Systems  Positive: See above Negative:   Physical Exam  BP 139/80   Pulse (!) 113   Temp 98.3 F (36.8 C) (Oral)   Resp 18   SpO2 97%  Gen:   Awake, no distress   Resp:  Normal effort  MSK:   Moves extremities without difficulty  Other:    Medical Decision Making  Medically screening exam initiated at 7:34 AM.  Appropriate orders placed.  Kelvis Berger was informed that the remainder of the evaluation will be completed by another provider, this initial triage assessment does not replace that evaluation, and the importance of remaining in the ED until their evaluation is complete.  Workup initiated   Su Monks, PA-C 06/25/23 0960

## 2023-06-25 NOTE — ED Provider Notes (Signed)
  [facility]   [id number] 06/25/23 Arrival Time: 1235  ASSESSMENT & PLAN:  1. Screening for STDs (sexually transmitted diseases)       Discharge Instructions      We have sent testing for sexually transmitted infections. We will notify you of any positive results once they are received. If required, we will prescribe any medications you might need.  Please refrain from all sexual activity for at least the next seven days.     Pending: Labs Reviewed  RPR  HIV ANTIBODY (ROUTINE TESTING W REFLEX)  CYTOLOGY, (ORAL, ANAL, URETHRAL) ANCILLARY ONLY    Will notify of any positive results. Instructed to refrain from sexual activity for at least seven days.  Reviewed expectations re: course of current medical issues. Questions answered. Outlined signs and symptoms indicating need for more acute intervention. Patient verbalized understanding. After Visit Summary given.   SUBJECTIVE:  Fernando Mccormick is a 33 y.o. male who requests STD testing. No symptoms.  OBJECTIVE:  Vitals:   06/25/23 1333 06/25/23 1335  BP: 118/69   Pulse:  82  Resp: 16   Temp:  98.9 F (37.2 C)  TempSrc:  Oral  SpO2: 98%      General appearance: alert, cooperative, appears stated age and no distress GU: deferred Skin: warm and dry Psychological: alert and cooperative; normal mood and affect.    Labs Reviewed  RPR  HIV ANTIBODY (ROUTINE TESTING W REFLEX)  CYTOLOGY, (ORAL, ANAL, URETHRAL) ANCILLARY ONLY    Allergies  Allergen Reactions   Shellfish Allergy Anaphylaxis    Past Medical History:  Diagnosis Date   ADHD (attention deficit hyperactivity disorder)    Bipolar 1 disorder (HCC)    Family History  Problem Relation Age of Onset   Stroke Father    Hypertension Other    Diabetes Other    Cancer Other    Social History   Socioeconomic History   Marital status: Single    Spouse name: Not on file   Number of children: Not on file   Years of education: Not on file    Highest education level: Not on file  Occupational History   Not on file  Tobacco Use   Smoking status: Every Day    Current packs/day: 0.25    Types: Cigarettes   Smokeless tobacco: Never  Vaping Use   Vaping status: Never Used  Substance and Sexual Activity   Alcohol use: Yes    Comment: 2-3 times a week   Drug use: Not Currently    Types: Marijuana   Sexual activity: Yes    Birth control/protection: Condom, None    Comment: Does not use condoms everytime  Other Topics Concern   Not on file  Social History Narrative   Not on file   Social Drivers of Health   Financial Resource Strain: Not on file  Food Insecurity: Not on file  Transportation Needs: Not on file  Physical Activity: Not on file  Stress: Not on file  Social Connections: Not on file  Intimate Partner Violence: Not on file           [place], MD 06/25/23 [phone]

## 2023-06-25 NOTE — ED Triage Notes (Signed)
Complains of n/v/d bodyaches  chills congestion x 2 days.

## 2023-06-26 LAB — CYTOLOGY, (ORAL, ANAL, URETHRAL) ANCILLARY ONLY
Chlamydia: NEGATIVE
Comment: NEGATIVE
Comment: NEGATIVE
Comment: NORMAL
Neisseria Gonorrhea: NEGATIVE
Trichomonas: NEGATIVE

## 2023-06-26 LAB — RPR: RPR Ser Ql: NONREACTIVE

## 2024-06-02 ENCOUNTER — Encounter (HOSPITAL_COMMUNITY): Payer: Self-pay | Admitting: Psychiatry

## 2024-06-02 ENCOUNTER — Other Ambulatory Visit: Payer: Self-pay

## 2024-06-02 ENCOUNTER — Other Ambulatory Visit (HOSPITAL_COMMUNITY)
Admission: EM | Admit: 2024-06-02 | Discharge: 2024-06-07 | Disposition: A | Discharge status: Other Acute Inpatient Hospital | Attending: Psychiatry | Admitting: Psychiatry

## 2024-06-02 ENCOUNTER — Ambulatory Visit (HOSPITAL_COMMUNITY)
Admission: EM | Admit: 2024-06-02 | Discharge: 2024-06-02 | Disposition: A | Discharge status: Home / Self Care | Attending: Psychiatry | Admitting: Psychiatry

## 2024-06-02 DIAGNOSIS — F419 Anxiety disorder, unspecified: Secondary | ICD-10-CM | POA: Insufficient documentation

## 2024-06-02 DIAGNOSIS — F1020 Alcohol dependence, uncomplicated: Secondary | ICD-10-CM | POA: Diagnosis not present

## 2024-06-02 DIAGNOSIS — Z560 Unemployment, unspecified: Secondary | ICD-10-CM | POA: Insufficient documentation

## 2024-06-02 DIAGNOSIS — F32A Depression, unspecified: Secondary | ICD-10-CM | POA: Insufficient documentation

## 2024-06-02 DIAGNOSIS — F1994 Other psychoactive substance use, unspecified with psychoactive substance-induced mood disorder: Secondary | ICD-10-CM

## 2024-06-02 DIAGNOSIS — F10929 Alcohol use, unspecified with intoxication, unspecified: Secondary | ICD-10-CM | POA: Diagnosis present

## 2024-06-02 DIAGNOSIS — Z79899 Other long term (current) drug therapy: Secondary | ICD-10-CM | POA: Diagnosis not present

## 2024-06-02 DIAGNOSIS — Z653 Problems related to other legal circumstances: Secondary | ICD-10-CM | POA: Diagnosis not present

## 2024-06-02 DIAGNOSIS — F1290 Cannabis use, unspecified, uncomplicated: Secondary | ICD-10-CM | POA: Insufficient documentation

## 2024-06-02 DIAGNOSIS — F319 Bipolar disorder, unspecified: Secondary | ICD-10-CM | POA: Diagnosis not present

## 2024-06-02 DIAGNOSIS — Z5982 Transportation insecurity: Secondary | ICD-10-CM | POA: Diagnosis not present

## 2024-06-02 DIAGNOSIS — R9431 Abnormal electrocardiogram [ECG] [EKG]: Secondary | ICD-10-CM | POA: Diagnosis not present

## 2024-06-02 DIAGNOSIS — F1090 Alcohol use, unspecified, uncomplicated: Secondary | ICD-10-CM

## 2024-06-02 DIAGNOSIS — F988 Other specified behavioral and emotional disorders with onset usually occurring in childhood and adolescence: Secondary | ICD-10-CM | POA: Diagnosis not present

## 2024-06-02 LAB — POCT URINE DRUG SCREEN - MANUAL ENTRY (I-SCREEN)
POC Amphetamine UR: NOT DETECTED
POC Buprenorphine (BUP): NOT DETECTED
POC Cocaine UR: NOT DETECTED
POC Marijuana UR: POSITIVE — AB
POC Methadone UR: NOT DETECTED
POC Methamphetamine UR: NOT DETECTED
POC Morphine: NOT DETECTED
POC Oxazepam (BZO): NOT DETECTED
POC Oxycodone UR: NOT DETECTED
POC Secobarbital (BAR): NOT DETECTED

## 2024-06-02 MED ORDER — ADULT MULTIVITAMIN W/MINERALS CH
1.0000 | ORAL_TABLET | Freq: Every day | ORAL | Status: DC
  Administered 2024-06-03 – 2024-06-06 (×4): 1 via ORAL
  Filled 2024-06-02: qty 1
  Filled 2024-06-02: qty 14
  Filled 2024-06-02 (×3): qty 1

## 2024-06-02 MED ORDER — LORAZEPAM 1 MG PO TABS: 1.0000 mg | ORAL_TABLET | Freq: Two times a day (BID) | ORAL | Status: DC

## 2024-06-02 MED ORDER — LORAZEPAM 2 MG/ML IJ SOLN: 2.0000 mg | Freq: Three times a day (TID) | INTRAMUSCULAR | Status: DC | PRN

## 2024-06-02 MED ORDER — LORAZEPAM 1 MG PO TABS: 1.0000 mg | ORAL_TABLET | Freq: Four times a day (QID) | ORAL | Status: DC | PRN

## 2024-06-02 MED ORDER — HYDROXYZINE HCL 25 MG PO TABS: 25.0000 mg | ORAL_TABLET | Freq: Four times a day (QID) | ORAL | Status: DC | PRN

## 2024-06-02 MED ORDER — LORAZEPAM 1 MG PO TABS
1.0000 mg | ORAL_TABLET | Freq: Four times a day (QID) | ORAL | Status: AC
  Administered 2024-06-02 – 2024-06-04 (×6): 1 mg via ORAL
  Filled 2024-06-02 (×6): qty 1

## 2024-06-02 MED ORDER — ONDANSETRON 4 MG PO TBDP: 4.0000 mg | ORAL_TABLET | Freq: Four times a day (QID) | ORAL | Status: DC | PRN

## 2024-06-02 MED ORDER — HALOPERIDOL 5 MG PO TABS: 5.0000 mg | ORAL_TABLET | Freq: Three times a day (TID) | ORAL | Status: DC | PRN

## 2024-06-02 MED ORDER — HYDROXYZINE HCL 25 MG PO TABS: 25.0000 mg | ORAL_TABLET | Freq: Four times a day (QID) | ORAL | Status: AC | PRN

## 2024-06-02 MED ORDER — DIPHENHYDRAMINE HCL 50 MG/ML IJ SOLN: 50.0000 mg | Freq: Three times a day (TID) | INTRAMUSCULAR | Status: DC | PRN

## 2024-06-02 MED ORDER — ACETAMINOPHEN 325 MG PO TABS: 650.0000 mg | ORAL_TABLET | Freq: Four times a day (QID) | ORAL | Status: DC | PRN

## 2024-06-02 MED ORDER — LORAZEPAM 1 MG PO TABS: 1.0000 mg | ORAL_TABLET | Freq: Three times a day (TID) | ORAL | Status: DC

## 2024-06-02 MED ORDER — LORAZEPAM 1 MG PO TABS
1.0000 mg | ORAL_TABLET | Freq: Two times a day (BID) | ORAL | Status: AC
  Administered 2024-06-05 – 2024-06-06 (×2): 1 mg via ORAL
  Filled 2024-06-02 (×2): qty 1

## 2024-06-02 MED ORDER — THIAMINE HCL 100 MG/ML IJ SOLN
100.0000 mg | Freq: Once | INTRAMUSCULAR | Status: DC
  Filled 2024-06-02: qty 2

## 2024-06-02 MED ORDER — TRAZODONE HCL 50 MG PO TABS: 50.0000 mg | ORAL_TABLET | Freq: Every evening | ORAL | Status: DC | PRN

## 2024-06-02 MED ORDER — HALOPERIDOL LACTATE 5 MG/ML IJ SOLN: 5.0000 mg | Freq: Three times a day (TID) | INTRAMUSCULAR | Status: DC | PRN

## 2024-06-02 MED ORDER — HALOPERIDOL LACTATE 5 MG/ML IJ SOLN: 10.0000 mg | Freq: Three times a day (TID) | INTRAMUSCULAR | Status: DC | PRN

## 2024-06-02 MED ORDER — TRAZODONE HCL 50 MG PO TABS
50.0000 mg | ORAL_TABLET | Freq: Every evening | ORAL | Status: DC | PRN
  Administered 2024-06-02 – 2024-06-04 (×3): 50 mg via ORAL
  Filled 2024-06-02: qty 14
  Filled 2024-06-02 (×3): qty 1

## 2024-06-02 MED ORDER — LORAZEPAM 1 MG PO TABS: 1.0000 mg | ORAL_TABLET | Freq: Every day | ORAL | Status: DC

## 2024-06-02 MED ORDER — DIPHENHYDRAMINE HCL 50 MG PO CAPS: 50.0000 mg | ORAL_CAPSULE | Freq: Three times a day (TID) | ORAL | Status: DC | PRN

## 2024-06-02 MED ORDER — THIAMINE MONONITRATE 100 MG PO TABS: 100.0000 mg | ORAL_TABLET | Freq: Every day | ORAL | Status: DC

## 2024-06-02 MED ORDER — LORAZEPAM 1 MG PO TABS
1.0000 mg | ORAL_TABLET | Freq: Four times a day (QID) | ORAL | Status: DC
  Administered 2024-06-02: 1 mg via ORAL
  Filled 2024-06-02: qty 1

## 2024-06-02 MED ORDER — THIAMINE HCL 100 MG/ML IJ SOLN
100.0000 mg | Freq: Once | INTRAMUSCULAR | Status: DC
  Administered 2024-06-02: 100 mg via INTRAMUSCULAR
  Filled 2024-06-02: qty 2

## 2024-06-02 MED ORDER — ONDANSETRON 4 MG PO TBDP: 4.0000 mg | ORAL_TABLET | Freq: Four times a day (QID) | ORAL | Status: AC | PRN

## 2024-06-02 MED ORDER — ALUM & MAG HYDROXIDE-SIMETH 200-200-20 MG/5ML PO SUSP: 30.0000 mL | ORAL | Status: DC | PRN

## 2024-06-02 MED ORDER — ADULT MULTIVITAMIN W/MINERALS CH
1.0000 | ORAL_TABLET | Freq: Every day | ORAL | Status: DC
  Administered 2024-06-02: 1 via ORAL
  Filled 2024-06-02: qty 1

## 2024-06-02 MED ORDER — LOPERAMIDE HCL 2 MG PO CAPS: 2.0000 mg | ORAL_CAPSULE | ORAL | Status: AC | PRN

## 2024-06-02 MED ORDER — MAGNESIUM HYDROXIDE 400 MG/5ML PO SUSP: 30.0000 mL | Freq: Every day | ORAL | Status: DC | PRN

## 2024-06-02 MED ORDER — THIAMINE MONONITRATE 100 MG PO TABS
100.0000 mg | ORAL_TABLET | Freq: Every day | ORAL | Status: DC
  Administered 2024-06-03 – 2024-06-06 (×4): 100 mg via ORAL
  Filled 2024-06-02: qty 14
  Filled 2024-06-02 (×4): qty 1

## 2024-06-02 MED ORDER — LORAZEPAM 1 MG PO TABS: 1.0000 mg | ORAL_TABLET | Freq: Four times a day (QID) | ORAL | Status: AC | PRN

## 2024-06-02 MED ORDER — LOPERAMIDE HCL 2 MG PO CAPS: 2.0000 mg | ORAL_CAPSULE | ORAL | Status: DC | PRN

## 2024-06-02 MED ORDER — LORAZEPAM 1 MG PO TABS
1.0000 mg | ORAL_TABLET | Freq: Three times a day (TID) | ORAL | Status: AC
  Administered 2024-06-04 – 2024-06-05 (×3): 1 mg via ORAL
  Filled 2024-06-02 (×3): qty 1
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
## 2024-06-02 NOTE — ED Notes (Signed)
 Pt oriented to unit, questions denied. Skin assessment- tottoos and minor scars, skin is intact. Pt declined dinner, saying that he ate prior to coming to unit. Pt currently watching TV in dayroom, no signs of distress observed.

## 2024-06-02 NOTE — ED Provider Notes (Signed)
 Behavioral Health Urgent Care Medical Screening Exam  Patient Name: Fernando Mccormick MRN: [id number] Date of Evaluation: 06/02/2024 Chief Complaint:  alcohol detox  Diagnosis:  Final diagnoses:  Alcohol use disorder, severe, dependence (HCC)  Substance induced mood disorder (HCC)   History of Present illness: Fernando Mccormick is a 34 y.o. male patient presented to [facility] as a walk in unaccompanied requesting alcohol detox.   Fernando Mccormick, 34 y.o., male patient seen face to face by this provider, chart reviewed, and consulted with Dr. Hermenia on 06/02/2024.   Per triage note, Fernando Mccormick is a 34 year old male presenting to [facility] unaccompanied. Pt states he is looking for detox at this time. Pt reports he has been struggling with alcohol for about 20 months. Pt states he was in jail and recently released in 2024. Pt states he drank last night about a bottle of liquor. Pt states he drinks 4 bottles per day, if not more. Pt also reports he is struggling with occasional marijuana use. Pt is diagnosed with Bipolar 1, PTSD, ADHD at this time. Pt states he is taking his anxiety/depression medication as prescribed. Pt has a engineer, drilling that can be reached at [phone] (Mr. Macel). Pt denies drug use, Si, Hi, NSSIB, AVH.   On assessment patient calm and cooperative.  He has normal speech and behavior.  States he was referred to [facility] by his parole officer for alcohol detox.  States his parole officer is in contact with [facility] and the plan is for patient to be transferred to their facility for residential treatment once detox is complete.  Patient has a history of incarceration and was released from jail in 2024.  He has been drinking every day since that time roughly 20 months.  He drinks liquor (vodka, tequila) and beer.  He drinks as much as I can get.  He drank roughly 1 bottle of liquor last night.  He denies any history of alcohol withdrawal seizures or delirium tremens.  He has never  completed alcohol detox.  He is currently endorsing decreased energy, anxiousness, and some shakes at times as his most recent alcohol withdrawal symptoms.  He endorses marijuana use roughly 2 times per week.   He currently endorses depression and anxiety.  He feels hopeless, irritable, anxious, and decreased sleep.  He endorses feeling an increase in his energy level.  He has a depressed affect.  He denies suicidal and homicidal ideations.  He denies any current auditory/visual hallucinations.  However he has experienced both in the past.  Roughly 1 month ago he reported seeing shadows.  He also reports that occasionally he may hear someone calling his name when no one is there.  He does not appear paranoid, delusional, manic or psychotic.  Discussed admission to the facility based crisis unit for alcohol detox and patient is in agreement.  Given patients reports of daily heavy alcohol intake an alcohol withdrawal taper will be initiated to prevent the onset of withdrawal.   Psychiatric and Social History Psychiatric History:  Information collected from chart review, patient   Prior psychiatric diagnosis: Reports a history of bipolar 1 disorder, PTSD, anxiety and ADHD Current medications: Reports taking Concerta 25 mg (not confirmed per PDMP) daily and trazodone  150 nightly.- Current provider: [facility]-receives both medication management and therapy weekly Prior Psych Hospitalization:denies Prior Self Harm: denies Prior Violence: when intoxicated can be aggressive   Family Psych History: denies Family Hx suicide: Denies   Social History:  Developmental Hx: Denies Educational  Hx: 12  grade Occupational Hx: unemployed  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 Also drinks beer. Pt states he drinks 4 bottles per  day, if not more.  Alcohol withdrawal sz/DT: denies Tobacco: Denies Illicit drugs: THC 2 x per week Prescription drug abuse: Denies Rehab hx: Denies     Flowsheet Row ED from 06/02/2024 in [facility] Most recent reading at 06/02/2024  3:54 PM UC from 06/25/2023 in [facility] at [place] Most recent reading at 06/25/2023  1:36 PM ED from 06/25/2023 in [facility] Emergency Department at [facility] Most recent reading at 06/25/2023  7:38 AM  C-SSRS RISK CATEGORY No Risk No Risk No Risk    Psychiatric Specialty Exam  Presentation  General Appearance:Appropriate for Environment; Casual  Eye Contact:Good  Speech:Clear and Coherent; Normal Rate  Speech Volume:Normal  Handedness:Right   Mood and Affect  Mood: Anxious; Depressed  Affect: Congruent   Thought Process  Thought Processes: Coherent  Descriptions of Associations:Intact  Orientation:Full (Time, Place and Person)  Thought Content:No data recorded   Hallucinations:None  Ideas of Reference:None  Suicidal Thoughts:No  Homicidal Thoughts:No   Sensorium  Memory: Immediate Good; Recent Good; Remote Good  Judgment: Fair  Insight: Fair   Art Therapist  Concentration: Good  Attention Span: Good  Recall: Good  Fund of Knowledge: Good  Language: Good   Psychomotor Activity  Psychomotor Activity: Normal   Assets  Assets: Desire for Improvement; Communication Skills; Financial Resources/Insurance; Housing; Physical Health; Resilience; Social Support   Sleep  Sleep: Fair  Number of hours:  4   Physical Exam: Physical Exam Constitutional:      Appearance: Normal appearance.  Cardiovascular:     Rate and Rhythm: Normal rate.  Pulmonary:     Effort: No respiratory distress.  Musculoskeletal:        General: Normal range of motion.     Cervical back: Normal range of motion.  Neurological:     Mental Status: He is alert  and oriented to person, place, and time.  Psychiatric:        Attention and Perception: Attention and perception normal.        Mood and Affect: Mood is anxious and depressed.        Speech: Speech normal.        Behavior: Behavior is cooperative.        Thought Content: Thought content normal.        Cognition and Memory: Cognition normal.        Judgment: Judgment is impulsive.    Review of Systems  Constitutional:  Negative for fever.  Respiratory:  Negative for cough.   Cardiovascular:  Negative for chest pain.  Musculoskeletal: Negative.   Neurological:  Negative for tremors.  Psychiatric/Behavioral:  Positive for depression and substance abuse. The patient is nervous/anxious.    Blood pressure 131/88, pulse 88, temperature 98.5 F (36.9 C), temperature source Oral, resp. rate 20, SpO2 99%. There is no height or weight on file to calculate BMI.  Musculoskeletal: Strength & Muscle Tone: within normal limits Gait & Station: normal Patient leans: N/A   [facility] MSE Discharge Disposition for Follow up and Recommendations: Based on my evaluation I certify that psychiatric inpatient services furnished can reasonably be expected to improve the patient's condition which  I recommend transfer to an appropriate accepting facility.   Admit to the [facility] for alcohol detox  Start CIWA protocol and Ativan  taper  Labwork ordered: CBC with differential CMP Ethanol Gamma GT Lipid panel TSH Hemoglobin A1c  EKG ordered   Elveria VEAR Batter, NP 06/02/2024, 5:10 PM

## 2024-06-02 NOTE — Progress Notes (Signed)
Pt transferred to [facility]. 

## 2024-06-02 NOTE — Discharge Instructions (Addendum)
Admit to [facility]
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
## 2024-06-03 DIAGNOSIS — F1290 Cannabis use, unspecified, uncomplicated: Secondary | ICD-10-CM | POA: Diagnosis not present

## 2024-06-03 DIAGNOSIS — F988 Other specified behavioral and emotional disorders with onset usually occurring in childhood and adolescence: Secondary | ICD-10-CM | POA: Diagnosis not present

## 2024-06-03 DIAGNOSIS — F1020 Alcohol dependence, uncomplicated: Secondary | ICD-10-CM | POA: Diagnosis not present

## 2024-06-03 DIAGNOSIS — F10929 Alcohol use, unspecified with intoxication, unspecified: Secondary | ICD-10-CM | POA: Diagnosis present

## 2024-06-03 DIAGNOSIS — F319 Bipolar disorder, unspecified: Secondary | ICD-10-CM | POA: Diagnosis not present

## 2024-06-03 LAB — CBC WITH DIFFERENTIAL/PLATELET
Abs Immature Granulocytes: 0.04 K/uL (ref 0.00–0.07)
Basophils Absolute: 0.1 K/uL (ref 0.0–0.1)
Basophils Relative: 1 %
Eosinophils Absolute: 0.1 K/uL (ref 0.0–0.5)
Eosinophils Relative: 2 %
HCT: 49 % (ref 39.0–52.0)
Hemoglobin: 16.3 g/dL (ref 13.0–17.0)
Immature Granulocytes: 1 %
Lymphocytes Relative: 43 %
Lymphs Abs: 3.1 K/uL (ref 0.7–4.0)
MCH: 28.8 pg (ref 26.0–34.0)
MCHC: 33.3 g/dL (ref 30.0–36.0)
MCV: 86.6 fL (ref 80.0–100.0)
Monocytes Absolute: 0.6 K/uL (ref 0.1–1.0)
Monocytes Relative: 9 %
Neutro Abs: 3.3 K/uL (ref 1.7–7.7)
Neutrophils Relative %: 44 %
Platelets: 306 K/uL (ref 150–400)
RBC: 5.66 MIL/uL (ref 4.22–5.81)
RDW: 12.9 % (ref 11.5–15.5)
WBC: 7.2 K/uL (ref 4.0–10.5)
nRBC: 0 % (ref 0.0–0.2)

## 2024-06-03 LAB — COMPREHENSIVE METABOLIC PANEL WITH GFR
ALT: 37 U/L (ref 0–44)
AST: 23 U/L (ref 15–41)
Albumin: 4.7 g/dL (ref 3.5–5.0)
Alkaline Phosphatase: 68 U/L (ref 38–126)
Anion gap: 12 (ref 5–15)
BUN: 15 mg/dL (ref 6–20)
CO2: 28 mmol/L (ref 22–32)
Calcium: 9.5 mg/dL (ref 8.9–10.3)
Chloride: 101 mmol/L (ref 98–111)
Creatinine, Ser: 1.11 mg/dL (ref 0.61–1.24)
GFR, Estimated: >60 mL/min
Glucose, Bld: 70 mg/dL (ref 70–99)
Potassium: 4.2 mmol/L (ref 3.5–5.1)
Sodium: 141 mmol/L (ref 135–145)
Total Bilirubin: 0.4 mg/dL (ref 0.0–1.2)
Total Protein: 7.4 g/dL (ref 6.5–8.1)

## 2024-06-03 LAB — TSH: TSH: 0.695 u[IU]/mL (ref 0.350–4.500)

## 2024-06-03 LAB — ETHANOL: Alcohol, Ethyl (B): <15 mg/dL

## 2024-06-03 MED ORDER — ALUM & MAG HYDROXIDE-SIMETH 200-200-20 MG/5ML PO SUSP: 30.0000 mL | ORAL | Status: DC | PRN

## 2024-06-03 MED ORDER — ACETAMINOPHEN 325 MG PO TABS
650.0000 mg | ORAL_TABLET | Freq: Four times a day (QID) | ORAL | Status: DC | PRN
  Administered 2024-06-03: 650 mg via ORAL
  Filled 2024-06-03: qty 2

## 2024-06-03 MED ORDER — MAGNESIUM HYDROXIDE 400 MG/5ML PO SUSP: 30.0000 mL | Freq: Every day | ORAL | Status: DC | PRN

## 2024-06-03 NOTE — Group Note (Signed)
 Group Topic: Emotional Regulation  Group Date: 06/03/2024 Start Time: 9251 End Time: 2018 Facilitators: Bryan Saddie CROME, [place]  Department: [facility]  Number of Participants: 8  Group Focus: anger management Treatment Modality:  Psychoeducation Interventions utilized were group exercise Purpose: enhance coping skills and express feelings  Name: Fernando Mccormick Date of Birth: 08/04/1990  MR: [id number]    Level of Participation: active Quality of Participation: attentive, cooperative, and engaged Interactions with others: gave feedback Mood/Affect: appropriate Triggers (if applicable): N/A Cognition: coherent/clear Progress: Moderate Response: N/A Plan: follow-up needed  Patients Problems:  Patient Active Problem List   Diagnosis Date Noted   Alcohol intoxication 06/03/2024   Alcohol use disorder, severe, dependence (HCC) 06/02/2024

## 2024-06-03 NOTE — Group Note (Signed)
 Date:  06/03/2024 Time:  3:12 PM  Group Topic/Focus:  Building Self Esteem:   The Focus of this group is helping patients become aware of the effects of self-esteem on their lives, the things they and others do that enhance or undermine their self-esteem, seeing the relationship between their level of self-esteem and the choices they make and learning ways to enhance self-esteem. Conflict Resolution:   The focus of this group is to discuss the conflict resolution process and how it may be used upon discharge. Goals Group:   The focus of this group is to help patients establish daily goals to achieve during treatment and discuss how the patient can incorporate goal setting into their daily lives to aide in recovery. Self Care:   The focus of this group is to help patients understand the importance of self-care in order to improve or restore emotional, physical, spiritual, interpersonal, and financial health.    Participation Level:  Active  Participation Quality:  Attentive  Affect:  Appropriate  Cognitive:  Appropriate  Insight: Appropriate  Engagement in Group:  Engaged  Modes of Intervention:  Discussion  Additional Comments:  I was able to do an individual interview with patient. Patient was positive and open to thinking outside the box with future goals.   Vaudine Dutan T Sienna Stonehocker 06/03/2024, 3:12 PM

## 2024-06-03 NOTE — Care Plan (Signed)
 Interdisciplinary Treatment and Diagnostic Plan Update  06/03/2024 Time of Session: Fernando Mccormick MRN: [id number]  Diagnosis:  Final diagnoses:  Substance induced mood disorder (HCC)  Depression, unspecified depression type  Alcohol use disorder     Current Medications:  Current Facility-Administered Medications  Medication Dose Route Frequency Provider Last Rate Last Admin   acetaminophen  (TYLENOL ) tablet 650 mg  650 mg Oral Q6H PRN Trudy Carwin, NP       alum & mag hydroxide-simeth (MAALOX/MYLANTA) 200-200-20 MG/5ML suspension 30 mL  30 mL Oral Q4H PRN Trudy Carwin, NP       haloperidol  (HALDOL ) tablet 5 mg  5 mg Oral TID PRN Mardy Elveria DEL, NP       And   diphenhydrAMINE  (BENADRYL ) capsule 50 mg  50 mg Oral TID PRN Mardy Elveria DEL, NP       haloperidol  lactate (HALDOL ) injection 5 mg  5 mg Intramuscular TID PRN Mardy Elveria DEL, NP       And   diphenhydrAMINE  (BENADRYL ) injection 50 mg  50 mg Intramuscular TID PRN Mardy Elveria DEL, NP       And   LORazepam  (ATIVAN ) injection 2 mg  2 mg Intramuscular TID PRN Mardy Elveria DEL, NP       haloperidol  lactate (HALDOL ) injection 10 mg  10 mg Intramuscular TID PRN Mardy Elveria DEL, NP       And   diphenhydrAMINE  (BENADRYL ) injection 50 mg  50 mg Intramuscular TID PRN Mardy Elveria DEL, NP       And   LORazepam  (ATIVAN ) injection 2 mg  2 mg Intramuscular TID PRN Coleman, Carolyn H, NP       hydrOXYzine  (ATARAX ) tablet 25 mg  25 mg Oral Q6H PRN Coleman, Carolyn H, NP       loperamide  (IMODIUM ) capsule 2-4 mg  2-4 mg Oral PRN Mardy Elveria DEL, NP       LORazepam  (ATIVAN ) tablet 1 mg  1 mg Oral Q6H PRN Coleman, Carolyn H, NP       LORazepam  (ATIVAN ) tablet 1 mg  1 mg Oral QID Coleman, Carolyn H, NP   1 mg at 06/03/24 1500   Followed by   NOREEN ON 06/04/2024] LORazepam  (ATIVAN ) tablet 1 mg  1 mg Oral TID Mardy Elveria DEL, NP       Followed by   NOREEN ON 06/05/2024] LORazepam  (ATIVAN ) tablet 1 mg  1 mg Oral BID  Mardy Elveria DEL, NP       Followed by   NOREEN ON 06/07/2024] LORazepam  (ATIVAN ) tablet 1 mg  1 mg Oral Daily Mardy Elveria DEL, NP       magnesium  hydroxide (MILK OF MAGNESIA) suspension 30 mL  30 mL Oral Daily PRN Trudy Carwin, NP       multivitamin with minerals tablet 1 tablet  1 tablet Oral Daily Mardy Elveria DEL, NP   1 tablet at 06/03/24 9071   ondansetron  (ZOFRAN -ODT) disintegrating tablet 4 mg  4 mg Oral Q6H PRN Mardy Elveria DEL, NP       thiamine  (VITAMIN B1) injection 100 mg  100 mg Intramuscular Once Mardy Elveria DEL, NP       thiamine  (VITAMIN B1) tablet 100 mg  100 mg Oral Daily Coleman, Carolyn H, NP   100 mg at 06/03/24 9071   traZODone  (DESYREL ) tablet 50 mg  50 mg Oral QHS PRN Coleman, Carolyn H, NP   50 mg at 06/02/24 2225   Current Outpatient Medications  Medication Sig Dispense  Refill   albuterol (VENTOLIN HFA) 108 (90 Base) MCG/ACT inhaler Inhale 2 puffs into the lungs every 6 (six) hours as needed for wheezing or shortness of breath.     methylphenidate 27 MG PO CR tablet Take 27 mg by mouth daily.     traZODone  (DESYREL ) 150 MG tablet Take 150 mg by mouth at bedtime.     PTA Medications: Prior to Admission medications  Medication Sig Start Date End Date Taking? Authorizing Provider  albuterol (VENTOLIN HFA) 108 (90 Base) MCG/ACT inhaler Inhale 2 puffs into the lungs every 6 (six) hours as needed for wheezing or shortness of breath.   Yes [provider]  methylphenidate 27 MG PO CR tablet Take 27 mg by mouth daily.   Yes [provider]  traZODone  (DESYREL ) 150 MG tablet Take 150 mg by mouth at bedtime.   Yes [provider]    Patient Stressors: Legal issue   Substance abuse    Patient Strengths: Manufacturing systems engineer  Motivation for treatment/growth   Treatment Modalities: Medication Management, Group therapy, Case management,  1 to 1 session with clinician, Psychoeducation, Recreational therapy.   Physician Treatment  Plan for Primary and Secondary Diagnosis:  Final diagnoses:  Substance induced mood disorder (HCC)  Depression, unspecified depression type  Alcohol use disorder   Long Term Goal(s):    Short Term Goals:    Medication Management: Evaluate patient's response, side effects, and tolerance of medication regimen.  Therapeutic Interventions: 1 to 1 sessions, Unit Group sessions and Medication administration.  Evaluation of Outcomes: Progressing  LCSW Treatment Plan for Primary Diagnosis:  Final diagnoses:  Substance induced mood disorder (HCC)  Depression, unspecified depression type  Alcohol use disorder    Long Term Goal(s): Safe transition to appropriate next level of care at discharge.  Short Term Goals: Facilitate acceptance of mental health diagnosis and concerns through verbal commitment to aftercare plan and appointments at discharge.  Therapeutic Interventions: Assess for all discharge needs, 1 to 1 time with Child psychotherapist, Explore available resources and support systems, Assess for adequacy in community support network, Educate family and significant other(s) on suicide prevention, Complete Psychosocial Assessment, Interpersonal group therapy.  Evaluation of Outcomes: Progressing   Progress in Treatment: Attending groups: Yes. Participating in groups: Yes. Taking medication as prescribed: Yes. Toleration medication: Yes, client is on a ativan  taper Family/Significant other contact made: Yes, individual(s) contacted:  Client has made contact with Patient understands diagnosis: Yes. Discussing patient identified problems/goals with staff: Yes. Medical problems stabilized or resolved: Yes. Denies suicidal/homicidal ideation: Yes. Issues/concerns per patient self-inventory: No. Other: Client reports seeking treatment due to his increased alcohol intake and marijuana use.  Client reports no SI/Hi and or plans.  Client has been approved for [facility] on 06/07/24.  Client will  complete door to door transfer. Client is also on federal probation.  Client's probation officer reports his charges have been dismissed and he will be released from probation in April of 2027.  New problem(s) identified: NA  New Short Term/Long Term Goal(s): Client will complete detox at [facility] and take medications as prescribed until he enters treatment at [facility].   Patient Goals:  get clean so I won't violate probation.  Discharge Plan or Barriers:  Client reports no barriers.   Reason for Continuation of Hospitalization: Anxiety Medication stabilization Withdrawal symptoms  Client will complete door to door treatment to [facility] on 06/07/24.    Estimated Length of Stay: 06/02/24 - 06/07/24  Last 3 [place] Suicide Severity Risk  Score: Flowsheet Row ED from 06/02/2024 in [facility] Most recent reading at 06/02/2024  6:50 PM ED from 06/02/2024 in [facility] Most recent reading at 06/02/2024  3:54 PM UC from 06/25/2023 in [facility] at [facility] Most recent reading at 06/25/2023  1:36 PM  C-SSRS RISK CATEGORY No Risk No Risk No Risk    Last PHQ 2/9 Scores:     No data to display          Scribe for Treatment Team: Tyja Gortney, LCSW 06/03/2024 4:42 PM

## 2024-06-03 NOTE — ED Notes (Signed)
 Pt sitting in hallway interacting with peers and staff. No acute distress noted. No concerns voiced. Informed pt to notify staff with any needs or assistance. Pt verbalized understanding and agreement. Will continue to monitor for safety.

## 2024-06-03 NOTE — ED Notes (Signed)
 Patient is in the bedroom calm and sleeping, NAD. Respirations even and unlabored. Environment secured per policy. Will monitor for safety.

## 2024-06-03 NOTE — ED Provider Notes (Signed)
 Facility Based Crisis Admission H&P  Date: 06/03/2024 Patient Name: Fernando Mccormick MRN: [id number] Chief Complaint: Alcohol detox  Diagnoses:  Final diagnoses:  Substance induced mood disorder (HCC)  Depression, unspecified depression type  Alcohol use disorder    HPI: Fernando Mccormick, 34 y.o., male admitted to [facility] from [facility] voluntarily for alcohol detox. Patient seen face to face by this provider, and chart reviewed on 06/03/2024.  Per chart review patient has a past psychiatric history of alcohol use disorder, ADD and bipolar 1 disorder.  No pertinent medical history.  Patient is not currently prescribed any medications.  No current outpatient providers.  Upon admission, patient's BAL was negative and urine drug screen was positive for marijuana.  On evaluation Colston Pyle reports that he has been feeling very tired and sleeping a lot throughout the day since admission.  He reports having a good appetite.  He currently denies suicidal ideations, homicidal ideations and psychotic symptoms.  Reports previously being diagnosed with bipolar 1 disorder and ADD.  He states that he is getting therapy and medication management through [facility].  Patient states that he initially came in because he has been drinking alcohol and smoking marijuana since he was released from jail 20 months ago.  Patient reports drinking as much as he can get .  He reports last drinking a bottle of liquor 2 nights ago and that he can drink up to 4 bottles of liquor in a day. He denies any history of alcohol withdrawal seizures and delirium tremens.  He denies history of residential substance abuse treatment programs.  Patient also endorses smoking marijuana a few times a week.  Patient says that he is currently on probation and that his probation officer is through [facility].  He states that he spoke with, Rosaline, at [facility] who informed him of detoxing at this location first and then transferring to [facility] for ongoing  residential treatment.  Patient endorses symptoms of depression at times including helplessness, sadness, decreased energy and decreased sleep or sleeping too much throughout the day.  Patient states that he is looking to continue with receiving residential treatment at the [facility] facility for alcohol abuse.  Patient currently lives with grandparents and does have a 73-year-old child.  He is currently unemployed.  Patient agrees to remain in the facility based crisis unit for continued detox and monitoring until placement at [facility] is available.   During evaluation Fernando Mccormick is laying down in bed, in no acute distress.  He is alert/oriented x 4; calm/cooperative; and mood congruent with affect.  He is speaking in a clear tone at moderate volume, and normal pace; with good eye contact.  His thought process is coherent and relevant; There is no objective indication that he is currently responding to internal/external stimuli or experiencing delusional thought content. He has denied suicidal ideation,self-harm, homicidal ideation, auditory hallucinations, visual hallucinations and/or paranoia. Patient has remained calm throughout assessment and has answered questions appropriately.       PHQ 2-9:   Flowsheet Row ED from 06/02/2024 in [facility] Most recent reading at 06/02/2024  6:50 PM ED from 06/02/2024 in [facility] Most recent reading at 06/02/2024  3:54 PM UC from 06/25/2023 in [facility] Most recent reading at 06/25/2023  1:36 PM  C-SSRS RISK CATEGORY No Risk No Risk No Risk    Screenings    Flowsheet Row Most Recent Value  CIWA-Ar Total 0    Total Time  spent with patient: 45 minutes  Musculoskeletal  Strength & Muscle Tone: within normal limits Gait & Station: normal Patient leans: N/A  Psychiatric Specialty Exam  Presentation General Appearance:  Appropriate for Environment; Casual  Eye  Contact: Good  Speech: Clear and Coherent; Normal Rate  Speech Volume: Normal  Handedness: Right   Mood and Affect  Mood: Anxious; Depressed  Affect: Congruent   Thought Process  Thought Processes: Coherent  Descriptions of Associations:Intact  Orientation:Full (Time, Place and Person)  Thought Content:No data recorded Diagnosis of Schizophrenia or Schizoaffective disorder in past: No   Hallucinations:Hallucinations: None  Ideas of Reference:None  Suicidal Thoughts:Suicidal Thoughts: No  Homicidal Thoughts:Homicidal Thoughts: No   Sensorium  Memory: Immediate Good; Recent Good; Remote Good  Judgment: Fair  Insight: Fair   Art Therapist  Concentration: Good  Attention Span: Good  Recall: Good  Fund of Knowledge: Good  Language: Good   Psychomotor Activity  Psychomotor Activity: Psychomotor Activity: Normal   Assets  Assets: Desire for Improvement; Communication Skills; Financial Resources/Insurance; Housing; Physical Health; Resilience; Social Support   Sleep  Sleep: Sleep: Fair Number of Hours of Sleep: 4   Nutritional Assessment (For OBS and FBC admissions only) Has the patient had a weight loss or gain of 10 pounds or more in the last 3 months?: No Has the patient had a decrease in food intake/or appetite?: No Does the patient have dental problems?: No Does the patient have eating habits or behaviors that may be indicators of an eating disorder including binging or inducing vomiting?: No Has the patient recently lost weight without trying?: 0 Has the patient been eating poorly because of a decreased appetite?: 0 Malnutrition Screening Tool Score: 0    Physical Exam Vitals and nursing note reviewed.  Constitutional:      Appearance: Normal appearance.  HENT:     Head: Normocephalic.     Nose: Nose normal.  Eyes:     Extraocular Movements: Extraocular movements intact.  Cardiovascular:     Rate and Rhythm:  Normal rate.  Pulmonary:     Effort: Pulmonary effort is normal.  Musculoskeletal:        General: Normal range of motion.     Cervical back: Normal range of motion.  Neurological:     General: No focal deficit present.     Mental Status: He is alert and oriented to person, place, and time.    Review of Systems  Constitutional: Negative.   HENT: Negative.    Eyes: Negative.   Respiratory: Negative.    Cardiovascular: Negative.   Gastrointestinal: Negative.   Genitourinary: Negative.   Musculoskeletal: Negative.   Neurological: Negative.   Endo/Heme/Allergies: Negative.   Psychiatric/Behavioral:  Positive for depression and substance abuse.     Blood pressure 129/65, pulse 65, temperature 97.8 F (36.6 C), temperature source Oral, resp. rate 20, SpO2 98%. There is no height or weight on file to calculate BMI.  Past Psychiatric History: alcohol use disorder, ADD and bipolar 1 disorder.  Patient was previously prescribed Concerta and Depakote about 1 year ago.  Patient denies inpatient psychiatric treatment.   Is the patient at risk to self? No  Has the patient been a risk to self in the past 6 months? No .    Has the patient been a risk to self within the distant past? No   Is the patient a risk to others? No   Has the patient been a risk to others in the past 6 months? No  Has the patient been a risk to others within the distant past? No   Past Medical History: None reported Family History:  Family History  Problem Relation Age of Onset   Stroke Father    Hypertension Other    Diabetes Other    Cancer Other     Social History: Patient was released from jail 20 months ago.  He is currently on probation through [facility].  He endorses alcohol and marijuana use.  Patient is currently unemployed and living with grandparents.  Last Labs:  Admission on 06/02/2024  Component Date Value Ref Range Status   WBC 06/02/2024 7.2  4.0 - 10.5 K/uL Final   RBC 06/02/2024 5.66  4.22  - 5.81 MIL/uL Final   Hemoglobin 06/02/2024 16.3  13.0 - 17.0 g/dL Final   HCT 98/92/7973 49.0  39.0 - 52.0 % Final   MCV 06/02/2024 86.6  80.0 - 100.0 fL Final   MCH 06/02/2024 28.8  26.0 - 34.0 pg Final   MCHC 06/02/2024 33.3  30.0 - 36.0 g/dL Final   RDW 98/92/7973 12.9  11.5 - 15.5 % Final   Platelets 06/02/2024 306  150 - 400 K/uL Final   nRBC 06/02/2024 0.0  0.0 - 0.2 % Final   Neutrophils Relative % 06/02/2024 44  % Final   Neutro Abs 06/02/2024 3.3  1.7 - 7.7 K/uL Final   Lymphocytes Relative 06/02/2024 43  % Final   Lymphs Abs 06/02/2024 3.1  0.7 - 4.0 K/uL Final   Monocytes Relative 06/02/2024 9  % Final   Monocytes Absolute 06/02/2024 0.6  0.1 - 1.0 K/uL Final   Eosinophils Relative 06/02/2024 2  % Final   Eosinophils Absolute 06/02/2024 0.1  0.0 - 0.5 K/uL Final   Basophils Relative 06/02/2024 1  % Final   Basophils Absolute 06/02/2024 0.1  0.0 - 0.1 K/uL Final   Immature Granulocytes 06/02/2024 1  % Final   Abs Immature Granulocytes 06/02/2024 0.04  0.00 - 0.07 K/uL Final   Performed at [facility], [address] [place], [place] [address]   Sodium 06/02/2024 141  135 - 145 mmol/L Final   Potassium 06/02/2024 4.2  3.5 - 5.1 mmol/L Final   Chloride 06/02/2024 101  98 - 111 mmol/L Final   CO2 06/02/2024 28  22 - 32 mmol/L Final   Glucose, Bld 06/02/2024 70  70 - 99 mg/dL Final   Glucose reference range applies only to samples taken after fasting for at least 8 hours.   BUN 06/02/2024 15  6 - 20 mg/dL Final   Creatinine, Ser 06/02/2024 1.11  0.61 - 1.24 mg/dL Final   Calcium 98/92/7973 9.5  8.9 - 10.3 mg/dL Final   Total Protein 98/92/7973 7.4  6.5 - 8.1 g/dL Final   Albumin 98/92/7973 4.7  3.5 - 5.0 g/dL Final   AST 98/92/7973 23  15 - 41 U/L Final   ALT 06/02/2024 37  0 - 44 U/L Final   Alkaline Phosphatase 06/02/2024 68  38 - 126 U/L Final   Total Bilirubin 06/02/2024 0.4  0.0 - 1.2 mg/dL Final   GFR, Estimated 06/02/2024 >60  >60 mL/min Final   Comment:  (NOTE) Calculated using the CKD-EPI Creatinine Equation (2021)    Anion gap 06/02/2024 12  5 - 15 Final   Performed at [facility], [address], [place], [place] [address]   Alcohol, Ethyl (B) 06/02/2024 <15  <15 mg/dL Final   Comment: (NOTE) For medical purposes only.  Performed at [facility], [address] [place], [place] [address]    TSH 06/02/2024 0.695  0.350 - 4.500 uIU/mL Final   Performed at [facility], [address], [place], [place] [address]  Admission on 06/02/2024, Discharged on 06/02/2024  Component Date Value Ref Range Status   POC Amphetamine UR 06/02/2024 None Detected  NONE DETECTED (Cut Off Level 1000 ng/mL) Final   POC Secobarbital (BAR) 06/02/2024 None Detected  NONE DETECTED (Cut Off Level 300 ng/mL) Final   POC Buprenorphine (BUP) 06/02/2024 None Detected  NONE DETECTED (Cut Off Level 10 ng/mL) Final   POC Oxazepam (BZO) 06/02/2024 None Detected  NONE DETECTED (Cut Off Level 300 ng/mL) Final   POC Cocaine UR 06/02/2024 None Detected  NONE DETECTED (Cut Off Level 300 ng/mL) Final   POC Methamphetamine UR 06/02/2024 None Detected  NONE DETECTED (Cut Off Level 1000 ng/mL) Final   POC Morphine  06/02/2024 None Detected  NONE DETECTED (Cut Off Level 300 ng/mL) Final   POC Methadone UR 06/02/2024 None Detected  NONE DETECTED (Cut Off Level 300 ng/mL) Final   POC Oxycodone  UR 06/02/2024 None Detected  NONE DETECTED (Cut Off Level 100 ng/mL) Final   POC Marijuana UR 06/02/2024 Positive (A)  NONE DETECTED (Cut Off Level 50 ng/mL) Final    Allergies: Patient has no known allergies.  Medications:  Facility Ordered Medications  Medication   hydrOXYzine  (ATARAX ) tablet 25 mg   loperamide  (IMODIUM ) capsule 2-4 mg   LORazepam  (ATIVAN ) tablet 1 mg   LORazepam  (ATIVAN ) tablet 1 mg   Followed by   NOREEN ON 06/04/2024] LORazepam  (ATIVAN ) tablet 1 mg   Followed by   NOREEN ON 06/05/2024] LORazepam  (ATIVAN ) tablet 1 mg   Followed by   NOREEN ON  06/07/2024] LORazepam  (ATIVAN ) tablet 1 mg   multivitamin with minerals tablet 1 tablet   ondansetron  (ZOFRAN -ODT) disintegrating tablet 4 mg   thiamine  (VITAMIN B1) injection 100 mg   thiamine  (VITAMIN B1) tablet 100 mg   traZODone  (DESYREL ) tablet 50 mg   haloperidol  (HALDOL ) tablet 5 mg   And   diphenhydrAMINE  (BENADRYL ) capsule 50 mg   haloperidol  lactate (HALDOL ) injection 5 mg   And   diphenhydrAMINE  (BENADRYL ) injection 50 mg   And   LORazepam  (ATIVAN ) injection 2 mg   haloperidol  lactate (HALDOL ) injection 10 mg   And   diphenhydrAMINE  (BENADRYL ) injection 50 mg   And   LORazepam  (ATIVAN ) injection 2 mg   acetaminophen  (TYLENOL ) tablet 650 mg   alum & mag hydroxide-simeth (MAALOX/MYLANTA) 200-200-20 MG/5ML suspension 30 mL   magnesium  hydroxide (MILK OF MAGNESIA) suspension 30 mL   PTA Medications  Medication Sig   traZODone  (DESYREL ) 150 MG tablet Take 150 mg by mouth at bedtime.   albuterol (VENTOLIN HFA) 108 (90 Base) MCG/ACT inhaler Inhale 2 puffs into the lungs every 6 (six) hours as needed for wheezing or shortness of breath.   methylphenidate 27 MG PO CR tablet Take 27 mg by mouth daily.    Long Term Goals: Improvement in symptoms so as ready for discharge  Short Term Goals: Patient will verbalize feelings in meetings with treatment team members., Patient will attend at least of 50% of the groups daily., Pt will complete the PHQ9 on admission, day 3 and discharge., Patient will participate in completing the [place] Suicide Severity Rating Scale, Patient will score a low risk of violence for 24 hours prior to discharge, and Patient will take medications as prescribed daily.  Medical  Decision Making  Pt admitted to [facility] for alcohol use and depressive symptoms. He has a PPHx of ADD, AUD and Bipolar 1 d/o. Pt reported drinking alcohol daily in the amount of 2-4 bottles of liquor a day for the past 20 months. He denies hx of complicated alcohol withdrawal and seizures. Pt  reports that he is prescribed Trazodone  and Concerta by outpatient provider at [facility] but unsure of dosage. He denies currently being prescribed medication for mood stabilization for Bipolar diagnosis. Chart reviewed and shows hx of Depakote ER 250mg  daily  prior to incarceration in 2024. Pt denies hx of manic symptoms. Pt does not currently display any acute manic symptoms. He does endorse some mild depressive symptoms in the context of alcohol abuse and life stressors. Does not feel he needs medication for mood at this time. Will discuss further tomorrow. Pt is currently on probation through [facility] and is hoping to transfer there for residential treatment upon discharge.   - Per SW, pt has been accepted to [facility] on 06/07/24.  - Will continue CIWA with Ativan  taper for to monitor for alcohol withdrawal.  - Labs reviewed, UDS positive for THC, BAL was negative. Remaining labs unremarkable.  - No changes to current treatment plan. Will continue to reassess pt daily.     Recommendations  Based on my evaluation the patient does not appear to have an emergency medical condition.  Alan JAYSON Mcardle, NP 06/03/2024  4:37 PM

## 2024-06-03 NOTE — Care Management (Signed)
 FBC Care Management.Scientist, Research (medical) met with the client to discuss discharge planning.  Writer informed the client the average stay is 3-5 days.  Client is currently on federal probation.  Staff will contact P.O. for updates and questions.  Client reports he's at [facility] to detox from alcohol.  Client reports consuming 4 bottles per day, if not more.    Client has been diagnosed with Bipolar 1, PTSD, and ADHD. Client has been diagnosed with Alcohol use disorder, severe, dependence & Substance induced Mood Disorder since being at [facility].     Writer faxed a referral over to [facility] for residential treatment.

## 2024-06-03 NOTE — Group Note (Signed)
 Group Topic: Recovery Basics  Group Date: 06/03/2024 Start Time: 8069 End Time: 2000 Facilitators: Verdon Jacqualyn BRAVO, NT  Department: [facility]  Number of Participants: 8  Group Focus: relapse prevention Treatment Modality:  Individual Therapy Interventions utilized were group exercise Purpose: relapse prevention strategies  Name: Fernando Mccormick Date of Birth: June 10, 1990  MR: [id number]    Level of Participation: active Quality of Participation: cooperative Interactions with others: gave feedback Mood/Affect: appropriate Triggers (if applicable): n/a Cognition: coherent/clear Progress: Moderate Response: n/a Plan: follow-up needed  Patients Problems:  Patient Active Problem List   Diagnosis Date Noted   Alcohol intoxication 06/03/2024   Alcohol use disorder, severe, dependence (HCC) 06/02/2024

## 2024-06-03 NOTE — Discharge Instructions (Signed)
 FBC Care Management...  Patient requested in patient treatment  Per [facility], patient has been accepted to complete 2 part intake assessment.   Appointment scheduled for Monday 06/07/2024 @ 9:00 am  Patient will discharge Monday 06/07/24 at 8:00 am  RN to arrange transportation  7 day samples and 30 day scripts

## 2024-06-03 NOTE — ED Notes (Signed)
 Pt sleeping at present, no distress noted.  Monitoring for safety.

## 2024-06-03 NOTE — ED Notes (Addendum)
 Patient asleep at this time. NAD.  CIWA assessment could not be completed. Will keep monitoring patient for safety

## 2024-06-03 NOTE — ED Notes (Signed)
 Pt sleeping in no acute distress. RR even and unlabored. Environment secured. Will continue to monitor for safety.

## 2024-06-03 NOTE — Group Note (Signed)
 Group Topic: Communication  Group Date: 06/03/2024 Start Time: 1705 End Time: 1730 Facilitators: Herold Lajuana NOVAK, RN  Department: [facility]  Number of Participants: 10  Group Focus: other unit rules  Treatment Modality:  Individual Therapy Interventions utilized were clarification Purpose: increase insight  Name: Fernando Mccormick Date of Birth: [birth year]  MR: [id number]    Level of Participation: active Quality of Participation: attentive Interactions with others: gave feedback Mood/Affect: appropriate Triggers (if applicable): none identified Cognition: coherent/clear Progress: Gaining insight Response: Pt verbalized understanding of rules of unit Plan: patient will be encouraged to remain tx compliant to rules of unit  Patients Problems:  Patient Active Problem List   Diagnosis Date Noted   Alcohol intoxication 06/03/2024   Alcohol use disorder, severe, dependence (HCC) 06/02/2024

## 2024-06-03 NOTE — ED Notes (Signed)
 Patient A&Ox4. Denies intent to harm self/others when asked. Denies A/VH. Patient denies any physical complaints when asked. No acute distress noted. Support and encouragement provided. Routine safety checks conducted according to facility protocol. Encouraged patient to notify staff if thoughts of harm toward self or others arise. Patient verbalize understanding and agreement. Will continue to monitor for safety.

## 2024-06-03 NOTE — ED Notes (Signed)
Pt A&O x 4, no distress noted. Calm & cooperative.  Monitoring for safety. 

## 2024-06-03 NOTE — Care Management (Addendum)
 FBC Care Management...  Writer received a returned phone call from the [facility], Joane Mullet [phone].  Client has given verbal consent and has signed the ROI for his P.O.  Joane reports the client is on supervised release until April of 2027. He reports the client charges have been dismissed for fleeing police officers and strangulation.     P.O. reports the client is receiving outpatient and medication management through [facility].  He reports the client was meeting virtually  bi-weekly with the the therapist.  P.O. reports the client was prescribed Trazodone (sleep) and Concerta for (ADHD).  P.O. reports the clients most recent substance abuse concerns are alcohol and marijuana.  P.O. report a prior history of opioid (zanax) substance use.    P.O. reports residential treatment would be great for the client and reports treatment over 30 days would also be beneficial.    Referrals have been sent to [facility] and [facility] for the client.  Writer informed the P.O. [facility] is currently in the process of moving and the likelihood of him being accepted there would be slim.  P.O. reports he understands and requested when he's accepted into a program to let him know.    UPDATE: 4:30PM  Client has been accepted into [facility] on 1/12/2.  Client's probation officer called the Writer to confirm the information.  Writer informed the Client we can provide a safe transport to [facility] on 06/07/24 for the client.  Probation Officer reports no further concerns and or questions at this time.

## 2024-06-03 NOTE — Care Management (Addendum)
 FBC Care Management...  Patient requested in patient treatment  Addendum 1:24 pm  Per [facility], patient has been accepted to complete 2 part intake assessment.   Appointment scheduled for Monday 06/07/2024 @ 9:00 am  Patient will discharge Monday 06/07/24 at 8:00 am  RN to arrange transportation  7 day samples and 30 day scripts   Addendum 11:58 am  Writer received call from [place] at [place],, She received the referral and sending to RN for acceptance  Per Olivia, they have coordinated with patient's probation officer for patient to come there when has has detoxed    Per providers notes Patient sent to [facility] for detox by probation officer that can be reached at [phone] (Mr. Macel).   Writer reached out to [place] at [place] to [employer] left HIPAA compliant message for return Product Manager will contact patients probation officer

## 2024-06-04 DIAGNOSIS — F988 Other specified behavioral and emotional disorders with onset usually occurring in childhood and adolescence: Secondary | ICD-10-CM | POA: Diagnosis not present

## 2024-06-04 DIAGNOSIS — F1020 Alcohol dependence, uncomplicated: Secondary | ICD-10-CM | POA: Diagnosis not present

## 2024-06-04 DIAGNOSIS — F1290 Cannabis use, unspecified, uncomplicated: Secondary | ICD-10-CM | POA: Diagnosis not present

## 2024-06-04 DIAGNOSIS — F319 Bipolar disorder, unspecified: Secondary | ICD-10-CM | POA: Diagnosis not present

## 2024-06-04 NOTE — ED Notes (Signed)
 Patient is sleeping in bedroom. NAD

## 2024-06-04 NOTE — Group Note (Signed)
 Group Topic: Understanding Self  Group Date: 06/04/2024 Start Time: 1900 End Time: 1930 Facilitators: Carollynn Genre, NT  Department: [facility]  Number of Participants: 6  Group Focus: acceptance Treatment Modality:  Individual Therapy Interventions utilized were tiPurpose: regain self-worth  Name: Fernando Mccormick Date of Birth: [birth year]  MR: [id number]    Level of Participation: active Quality of Participation: engaged Interactions with others: gave feedback Mood/Affect: appropriate Triggers (if applicable): N A Cognition: not focused Progress: Gaining insight Response: good Plan: follow-up needed  Patients Problems:  Patient Active Problem List   Diagnosis Date Noted   Alcohol intoxication 06/03/2024   Alcohol use disorder, severe, dependence (HCC) 06/02/2024

## 2024-06-04 NOTE — ED Notes (Signed)
 Per pt observation, pt does not seem to have withdrawal symptoms reflecting ciwa score.

## 2024-06-04 NOTE — ED Notes (Addendum)
 Pt presents with a flat affect, sits with peers in community room , has snacks and then leaves, refusing to attend group, although patient was encouraged by staff to attend group, pt stated, its christian oriented, and I am a Muslim I aint attending. Safety maintained.

## 2024-06-04 NOTE — Group Note (Signed)
 Group Topic: Decisional Balance/Substance Abuse  Group Date: 06/04/2024 Start Time: 1100 End Time: 1200 Facilitators: Kiffany Schelling, LCSW  Department: [facility]  Number of Participants: 5  Group Focus: acceptance, art therapy, chemical dependency education, chemical dependency issues, community group, and coping skills Treatment Modality:  Art Therapy and Cognitive Behavioral Therapy Interventions utilized were group exercise, patient education, and support Purpose: enhance coping skills, express feelings, express irrational fears, improve communication skills, regain self-worth, reinforce self-care, relapse prevention strategies, and trigger / craving management  Writer met with the group to explore how they view themselves, how the world views them, and how substance abuse factors into self-worth/perception.  Writer utilized Art Therapy and CBT to explore these topics.  Writer utilized a worksheet were the client had to depict themselves vs how the world views them.  Writer explored triggers and coping skills with the group.  Name: Fernando Mccormick Date of Birth: June 15, 1990  MR: [id number]    Level of Participation: active Quality of Participation: attentive, cooperative, engaged, and initiates communication Interactions with others: gave feedback Mood/Affect: appropriate and brightens with interaction Triggers (if applicable): partying, being around other who use, probation (going back to prison) Cognition: concrete, goal directed, logical, and pressured speech Progress: Moderate Response: Client was engaged in group and shared how his current substance use is impacting his life.  Plan: follow-up  as needed.  Client will enter into [facility] on 06/07/24  Patients Problems:  Patient Active Problem List   Diagnosis Date Noted   Alcohol intoxication 06/03/2024   Alcohol use disorder, severe, dependence (HCC) 06/02/2024

## 2024-06-04 NOTE — Progress Notes (Addendum)
 Spiritual Care and Counseling Group Note  06/04/2024 2:45pm  Facilitated by: Librada Donnice Lin   Type of Therapy and Topic:  Hope    Participation Level:  Did Not Attend  Description of Group:  Group focused on topic of hope.  Patients participated in facilitated discussion around topic, connecting with one another around experiences and definitions for hope.  Group members engaged with group word cloud.  Members selected an image of what hope looks like for them today.  Group engaged in discussion around how their definitions of hope are present today in hospital.    Summary of Patient Progress: Pt invited to group.  DID NOT ATTEND     Therapeutic Modalities: Psycho-social ed, Adlerian, Narrative, MI   Librada Donnice Lin, Chaplain 06/04/2024 4:15 PM

## 2024-06-04 NOTE — ED Provider Notes (Signed)
 Facility Based Crisis Progress Note  Date: 06/04/2024 Patient Name: Fernando Mccormick MRN: [id number] Chief Complaint: Alcohol detox  Diagnoses:  Final diagnoses:  Substance induced mood disorder (HCC)  Depression, unspecified depression type  Alcohol use disorder  Marijuana use    HPI: Fernando Mccormick, 34 y.o., male admitted to [facility] from [facility] voluntarily for alcohol detox. Patient seen face to face by this provider, and chart reviewed on 06/04/2024.  Per chart review patient has a past psychiatric history of alcohol use disorder, ADD and bipolar 1 disorder.  No pertinent medical history.  Patient is not currently prescribed any medications.  No current outpatient providers.  Upon admission, patient's BAL was negative and urine drug screen was positive for marijuana.  During evaluation, Fernando Mccormick was observed lying in bed and in no acute distress. He was alert and oriented 4, calm and cooperative, with mood congruent with affect. Speech was clear, of normal rate, rhythm, and volume, with good eye contact. Thought processes were coherent and relevant, with no objective evidence of response to internal or external stimuli or delusional thought content. He denied suicidal ideation, self-harm, homicidal ideation, auditory or visual hallucinations, and paranoia. He remained calm throughout the assessment and answered questions appropriately.  The patient was seen and assessed and reported that he is sleeping well and has a good appetite. He rated his depression as 5/10 and anxiety as 0/10 (10 = worst). He stated plans to attend [facility] on Monday. Although mildly irritable, he remained cooperative, actively participated in treatment, asked appropriate questions, and remained focused on his goals. He again denied SI/HI/AVH. During the interview, he was observed making up his bed, demonstrating appropriate engagement and activity.   PHQ 2-9:  Flowsheet Row ED from 06/02/2024 in [facility]  Thoughts that you would be better off dead, or of hurting yourself in some way Not at all  PHQ-9 Total Score 6    Flowsheet Row ED from 06/02/2024 in [facility] Most recent reading at 06/02/2024  6:50 PM ED from 06/02/2024 in [facility] Most recent reading at 06/02/2024  3:54 PM UC from 06/25/2023 in [facility] [place] Most recent reading at 06/25/2023  1:36 PM  C-SSRS RISK CATEGORY No Risk No Risk No Risk    Screenings    Flowsheet Row Most Recent Value  CIWA-Ar Total 2    Total Time spent with patient: 20 minutes  Musculoskeletal  Strength & Muscle Tone: within normal limits Gait & Station: normal Patient leans: N/A  Psychiatric Specialty Exam  Presentation General Appearance:  Appropriate for Environment; Casual  Eye Contact: Good  Speech: Clear and Coherent  Speech Volume: Normal  Handedness: Right   Mood and Affect  Mood: Dysphoric  Affect: Appropriate   Thought Process  Thought Processes: Coherent; Linear  Descriptions of Associations:Intact  Orientation:Full (Time, Place and Person)  Thought Content:WDL  Diagnosis of Schizophrenia or Schizoaffective disorder in past: No   Hallucinations:Hallucinations: None  Ideas of Reference:None  Suicidal Thoughts:Suicidal Thoughts: No  Homicidal Thoughts:Homicidal Thoughts: No   Sensorium  Memory: Immediate Good; Recent Good  Judgment: Good  Insight: Good   Executive Functions  Concentration: Good  Attention Span: Good  Recall: Good  Fund of Knowledge: Good  Language: Good   Psychomotor Activity  Psychomotor Activity: Psychomotor Activity: Normal   Assets  Assets: Desire for Improvement; Housing; Physical Health; Social Support; Vocational/Educational; Resilience   Sleep  Sleep: Sleep: Good  Number of Hours of Sleep: 8   No data recorded   Physical Exam Vitals and  nursing note reviewed.  Constitutional:      Appearance: Normal appearance.  HENT:     Head: Normocephalic.     Nose: Nose normal.  Eyes:     Extraocular Movements: Extraocular movements intact.  Cardiovascular:     Rate and Rhythm: Normal rate.  Pulmonary:     Effort: Pulmonary effort is normal.  Musculoskeletal:        General: Normal range of motion.     Cervical back: Normal range of motion.  Neurological:     General: No focal deficit present.     Mental Status: He is alert and oriented to person, place, and time. Mental status is at baseline.  Psychiatric:        Mood and Affect: Mood normal.        Behavior: Behavior normal.        Thought Content: Thought content normal.        Judgment: Judgment normal.    Review of Systems  Constitutional: Negative.   HENT: Negative.    Eyes: Negative.   Respiratory: Negative.    Cardiovascular: Negative.   Gastrointestinal: Negative.   Genitourinary: Negative.   Musculoskeletal: Negative.   Neurological: Negative.   Endo/Heme/Allergies: Negative.   Psychiatric/Behavioral:  Positive for depression and substance abuse.   All other systems reviewed and are negative.   Blood pressure 137/78, pulse 75, temperature 98 F (36.7 C), temperature source Oral, resp. rate 18, SpO2 98%. There is no height or weight on file to calculate BMI.  Past Psychiatric History: alcohol use disorder, ADD and bipolar 1 disorder.  Patient was previously prescribed Concerta and Depakote about 1 year ago.  Patient denies inpatient psychiatric treatment.   Is the patient at risk to self? No  Has the patient been a risk to self in the past 6 months? No .    Has the patient been a risk to self within the distant past? No   Is the patient a risk to others? No   Has the patient been a risk to others in the past 6 months? No   Has the patient been a risk to others within the distant past? No   Past Medical History: None reported Family History:  Family  History  Problem Relation Age of Onset   Stroke Father    Hypertension Other    Diabetes Other    Cancer Other     Social History: Patient was released from jail 20 months ago.  He is currently on probation through [facility].  He endorses alcohol and marijuana use.  Patient is currently unemployed and living with grandparents.  Last Labs:  Admission on 06/02/2024  Component Date Value Ref Range Status   WBC 06/02/2024 7.2  4.0 - 10.5 K/uL Final   RBC 06/02/2024 5.66  4.22 - 5.81 MIL/uL Final   Hemoglobin 06/02/2024 16.3  13.0 - 17.0 g/dL Final   HCT 98/92/7973 49.0  39.0 - 52.0 % Final   MCV 06/02/2024 86.6  80.0 - 100.0 fL Final   MCH 06/02/2024 28.8  26.0 - 34.0 pg Final   MCHC 06/02/2024 33.3  30.0 - 36.0 g/dL Final   RDW 98/92/7973 12.9  11.5 - 15.5 % Final   Platelets 06/02/2024 306  150 - 400 K/uL Final   nRBC 06/02/2024 0.0  0.0 - 0.2 % Final   Neutrophils Relative % 06/02/2024 44  %  Final   Neutro Abs 06/02/2024 3.3  1.7 - 7.7 K/uL Final   Lymphocytes Relative 06/02/2024 43  % Final   Lymphs Abs 06/02/2024 3.1  0.7 - 4.0 K/uL Final   Monocytes Relative 06/02/2024 9  % Final   Monocytes Absolute 06/02/2024 0.6  0.1 - 1.0 K/uL Final   Eosinophils Relative 06/02/2024 2  % Final   Eosinophils Absolute 06/02/2024 0.1  0.0 - 0.5 K/uL Final   Basophils Relative 06/02/2024 1  % Final   Basophils Absolute 06/02/2024 0.1  0.0 - 0.1 K/uL Final   Immature Granulocytes 06/02/2024 1  % Final   Abs Immature Granulocytes 06/02/2024 0.04  0.00 - 0.07 K/uL Final   Performed at [facility], [address] [place], [place] [address]   Sodium 06/02/2024 141  135 - 145 mmol/L Final   Potassium 06/02/2024 4.2  3.5 - 5.1 mmol/L Final   Chloride 06/02/2024 101  98 - 111 mmol/L Final   CO2 06/02/2024 28  22 - 32 mmol/L Final   Glucose, Bld 06/02/2024 70  70 - 99 mg/dL Final   Glucose reference range applies only to samples taken after fasting for at least 8 hours.   BUN 06/02/2024 15  6  - 20 mg/dL Final   Creatinine, Ser 06/02/2024 1.11  0.61 - 1.24 mg/dL Final   Calcium 98/92/7973 9.5  8.9 - 10.3 mg/dL Final   Total Protein 98/92/7973 7.4  6.5 - 8.1 g/dL Final   Albumin 98/92/7973 4.7  3.5 - 5.0 g/dL Final   AST 98/92/7973 23  15 - 41 U/L Final   ALT 06/02/2024 37  0 - 44 U/L Final   Alkaline Phosphatase 06/02/2024 68  38 - 126 U/L Final   Total Bilirubin 06/02/2024 0.4  0.0 - 1.2 mg/dL Final   GFR, Estimated 06/02/2024 >60  >60 mL/min Final   Comment: (NOTE) Calculated using the CKD-EPI Creatinine Equation (2021)    Anion gap 06/02/2024 12  5 - 15 Final   Performed at [facility], [address] [place], [place] [address]   Alcohol, Ethyl (B) 06/02/2024 <15  <15 mg/dL Final   Comment: (NOTE) For medical purposes only. Performed at [facility], [address], [place], [place] [address]    TSH 06/02/2024 0.695  0.350 - 4.500 uIU/mL Final   Performed at [facility], [address], [place], [place] [address]  Admission on 06/02/2024, Discharged on 06/02/2024  Component Date Value Ref Range Status   POC Amphetamine UR 06/02/2024 None Detected  NONE DETECTED (Cut Off Level 1000 ng/mL) Final   POC Secobarbital (BAR) 06/02/2024 None Detected  NONE DETECTED (Cut Off Level 300 ng/mL) Final   POC Buprenorphine (BUP) 06/02/2024 None Detected  NONE DETECTED (Cut Off Level 10 ng/mL) Final   POC Oxazepam (BZO) 06/02/2024 None Detected  NONE DETECTED (Cut Off Level 300 ng/mL) Final   POC Cocaine UR 06/02/2024 None Detected  NONE DETECTED (Cut Off Level 300 ng/mL) Final   POC Methamphetamine UR 06/02/2024 None Detected  NONE DETECTED (Cut Off Level 1000 ng/mL) Final   POC Morphine  06/02/2024 None Detected  NONE DETECTED (Cut Off Level 300 ng/mL) Final   POC Methadone UR 06/02/2024 None Detected  NONE DETECTED (Cut Off Level 300 ng/mL) Final   POC Oxycodone  UR 06/02/2024 None Detected  NONE DETECTED (Cut Off Level 100 ng/mL) Final   POC Marijuana UR  06/02/2024 Positive (A)  NONE DETECTED (Cut Off Level 50  ng/mL) Final    Allergies: Patient has no known allergies.  Medications:  Facility Ordered Medications  Medication   hydrOXYzine  (ATARAX ) tablet 25 mg   loperamide  (IMODIUM ) capsule 2-4 mg   LORazepam  (ATIVAN ) tablet 1 mg   [COMPLETED] LORazepam  (ATIVAN ) tablet 1 mg   Followed by   LORazepam  (ATIVAN ) tablet 1 mg   Followed by   Fernando Mccormick ON 06/05/2024] LORazepam  (ATIVAN ) tablet 1 mg   Followed by   Fernando Mccormick ON 06/07/2024] LORazepam  (ATIVAN ) tablet 1 mg   multivitamin with minerals tablet 1 tablet   ondansetron  (ZOFRAN -ODT) disintegrating tablet 4 mg   thiamine  (VITAMIN B1) injection 100 mg   thiamine  (VITAMIN B1) tablet 100 mg   traZODone  (DESYREL ) tablet 50 mg   haloperidol  (HALDOL ) tablet 5 mg   And   diphenhydrAMINE  (BENADRYL ) capsule 50 mg   haloperidol  lactate (HALDOL ) injection 5 mg   And   diphenhydrAMINE  (BENADRYL ) injection 50 mg   And   LORazepam  (ATIVAN ) injection 2 mg   haloperidol  lactate (HALDOL ) injection 10 mg   And   diphenhydrAMINE  (BENADRYL ) injection 50 mg   And   LORazepam  (ATIVAN ) injection 2 mg   acetaminophen  (TYLENOL ) tablet 650 mg   alum & mag hydroxide-simeth (MAALOX/MYLANTA) 200-200-20 MG/5ML suspension 30 mL   magnesium  hydroxide (MILK OF MAGNESIA) suspension 30 mL   PTA Medications  Medication Sig   traZODone  (DESYREL ) 150 MG tablet Take 150 mg by mouth at bedtime.   albuterol (VENTOLIN HFA) 108 (90 Base) MCG/ACT inhaler Inhale 2 puffs into the lungs every 6 (six) hours as needed for wheezing or shortness of breath.   methylphenidate 27 MG PO CR tablet Take 27 mg by mouth daily.    Long Term Goals: Improvement in symptoms so as ready for discharge  Short Term Goals: Patient will verbalize feelings in meetings with treatment team members., Patient will attend at least of 50% of the groups daily., Pt will complete the PHQ9 on admission, day 3 and discharge., Patient will participate in  completing the [place] Suicide Severity Rating Scale, Patient will score a low risk of violence for 24 hours prior to discharge, and Patient will take medications as prescribed daily.  Medical Decision Making  Pt admitted to [facility] for alcohol use and depressive symptoms. He has a PPHx of ADD, AUD and Bipolar 1 d/o. Pt reported drinking alcohol daily in the amount of 2-4 bottles of liquor a day for the past 20 months. He denies hx of complicated alcohol withdrawal and seizures. Pt reports that he is prescribed Trazodone  and Concerta by outpatient provider at [facility] but unsure of dosage. He denies currently being prescribed medication for mood stabilization for Bipolar diagnosis. Chart reviewed and shows hx of Depakote ER 250mg  daily  prior to incarceration in 2024. Pt denies hx of manic symptoms. Pt does not currently display any acute manic symptoms. He does endorse some mild depressive symptoms in the context of alcohol abuse and life stressors. Does not feel he needs medication for mood at this time. Will discuss further tomorrow. Pt is currently on probation through [facility] and is hoping to transfer there for residential treatment upon discharge.   - Per SW, pt has been accepted to [facility] on 06/07/24.  - Will continue CIWA with Ativan  taper for to monitor for alcohol withdrawal. CIWA 3.  - Labs reviewed, UDS positive for THC, BAL was negative. Remaining labs unremarkable.  - No changes to current treatment plan. Will continue to reassess pt daily. Will benefit from  mood stabilizer, will continue to allow detox adequately at this time.     Recommendations  Based on my evaluation the patient does not appear to have an emergency medical condition.  Majel GORMAN Ramp, [name] 06/04/2024  4:18 PM

## 2024-06-04 NOTE — Group Note (Signed)
 Group Topic: Understanding Self  Group Date: 06/03/2024 Start Time: 1000 End Time: 1050 Facilitators: Gerome Jolly, NT  Department: [facility]  Number of Participants: 5  Group Focus: acceptance and chemical dependency education NA IP #9 Self-acceptance   Treatment Modality:  Cognitive Behavioral Therapy, Psychoeducation, and Spiritual Interventions utilized were exploration Purpose: explore maladaptive thinking, relapse prevention strategies, and to gain understanding of the lack of self-acceptance in recovery    Name: Aulton Routt Date of Birth: 1991-03-15  MR: [id number]    Level of Participation: active Quality of Participation: attentive, cooperative, and engaged Interactions with others: gave feedback Mood/Affect: appropriate Triggers (if applicable): na Cognition: coherent/clear, goal directed, and insightful Progress: Moderate Response: patient identified area where he wore a mask instead of achieving self-acceptance  Plan: referral / recommendations patient has been accepted to [facility] residential on Monday 06/07/24  Patients Problems:  Patient Active Problem List   Diagnosis Date Noted   Alcohol intoxication 06/03/2024   Alcohol use disorder, severe, dependence (HCC) 06/02/2024

## 2024-06-04 NOTE — Group Note (Signed)
 Group Topic: Relapse and Recovery  Group Date: 06/04/2024 Start Time: 1300 End Time: 1330 Facilitators: Judi Monico [name], NT  Department: [facility]  Number of Participants: 1  Group Focus: check in Treatment Modality:  Psychoeducation Interventions utilized were patient education Purpose: enhance coping skills, express feelings, and increase insight  Name: Fernando Mccormick Date of Birth: 08-Mar-1991  MR: [id number]    Level of Participation: Pt did not attend group Patients Problems:  Patient Active Problem List   Diagnosis Date Noted   Alcohol intoxication 06/03/2024   Alcohol use disorder, severe, dependence (HCC) 06/02/2024

## 2024-06-04 NOTE — Group Note (Signed)
 Group Topic: Wellness  Group Date: 06/04/2024 Start Time: 1200 End Time: 1230 Facilitators: Daved Tinnie HERO, RN  Department: [facility]  Number of Participants: 5  Group Focus: nursing group Treatment Modality:  Psychoeducation Interventions utilized were patient education Purpose: increase insight  Name: Fernando Mccormick Date of Birth: February 08, 1991  MR: [id number]    Level of Participation: moderate Quality of Participation: cooperative Interactions with others: gave feedback Mood/Affect: appropriate Triggers (if applicable): n/a Cognition: coherent/clear Progress: Gaining insight Response: pt expressed understanding of medications Plan: patient will be encouraged to attend future RN education groups  Patients Problems:  Patient Active Problem List   Diagnosis Date Noted   Alcohol intoxication 06/03/2024   Alcohol use disorder, severe, dependence (HCC) 06/02/2024

## 2024-06-04 NOTE — ED Notes (Signed)
 Pt reports to feel 'alright'. Pt denies physical pain and discomforts, however, also says that he feels generally 'uncomfortable'. Medications reviewed, questions denied. Pt denies si hi and avh, verbal contract for safety provided.

## 2024-06-05 DIAGNOSIS — F319 Bipolar disorder, unspecified: Secondary | ICD-10-CM | POA: Diagnosis not present

## 2024-06-05 DIAGNOSIS — F1020 Alcohol dependence, uncomplicated: Secondary | ICD-10-CM | POA: Diagnosis not present

## 2024-06-05 DIAGNOSIS — F988 Other specified behavioral and emotional disorders with onset usually occurring in childhood and adolescence: Secondary | ICD-10-CM | POA: Diagnosis not present

## 2024-06-05 DIAGNOSIS — F1290 Cannabis use, unspecified, uncomplicated: Secondary | ICD-10-CM | POA: Diagnosis not present

## 2024-06-05 MED ORDER — TRAZODONE HCL 150 MG PO TABS
150.0000 mg | ORAL_TABLET | Freq: Every evening | ORAL | Status: DC | PRN
  Administered 2024-06-05 – 2024-06-06 (×2): 150 mg via ORAL
  Filled 2024-06-05: qty 14
  Filled 2024-06-05 (×2): qty 1

## 2024-06-05 NOTE — Group Note (Signed)
 Group Topic: Communication  Group Date: 06/05/2024 Start Time: 1150 End Time: 1220 Facilitators: Herold Lajuana NOVAK, RN  Department: [facility]  Number of Participants: 8  Group Focus: personal responsibility Treatment Modality:  Leisure Development Interventions utilized were confrontation Purpose: increase insight  Name: Fernando Mccormick Date of Birth: 02/22/1991  MR: [id number]    Level of Participation: active Quality of Participation: attentive Interactions with others: gave feedback Mood/Affect: appropriate Triggers (if applicable): none identified Cognition: coherent/clear Progress: Gaining insight Response: Pt verbalized understanding and agreement of rules of facility Plan: patient will be encouraged to remain tx compliant and seek staff assistance with any needs or concerns  Patients Problems:  Patient Active Problem List   Diagnosis Date Noted   Alcohol intoxication 06/03/2024   Alcohol use disorder, severe, dependence (HCC) 06/02/2024

## 2024-06-05 NOTE — Group Note (Signed)
 Group Topic: Change and Accountability  Group Date: 06/05/2024 Start Time: 1900 End Time: 1930 Facilitators: Carollynn Genre, NT  Department: [facility]  Number of Participants: 5  Group Focus: acceptance Treatment Modality:  Family Therapy Interventions utilized were support Purpose: explore maladaptive thinking  Name: Fernando Mccormick Date of Birth: 10/29/1990  MR: [id number]    Level of Participation: did not attend Quality of Participation:  did not attend. Interactions with others: did not attend Mood/Affect:  Triggers (if applicable): did not attend Cognition:  n a Progress: Other Response: did not attend Plan: patient will be encouraged to  to go next time.  Patients Problems:  Patient Active Problem List   Diagnosis Date Noted   Alcohol intoxication 06/03/2024   Alcohol use disorder, severe, dependence (HCC) 06/02/2024

## 2024-06-05 NOTE — ED Notes (Signed)
 Pt sitting in dayroom watching television and interacting with peers. No acute distress noted. No concerns voiced. Informed pt to notify staff with any needs or assistance. Pt verbalized understanding and agreement. Will continue to monitor for safety.

## 2024-06-05 NOTE — ED Provider Notes (Signed)
 Facility Based Crisis Progress Note  Date: 06/05/2024 Patient Name: Fernando Mccormick MRN: [id number] Chief Complaint: Alcohol detox  Diagnoses:  Final diagnoses:  Substance induced mood disorder (HCC)  Depression, unspecified depression type  Alcohol use disorder  Marijuana use    HPI: Fernando Mccormick, 34 y.o., male admitted to [facility] from [facility] voluntarily for alcohol detox. Patient seen face to face by this provider, and chart reviewed on 06/05/2024.  Per chart review patient has a past psychiatric history of alcohol use disorder, ADD and bipolar 1 disorder.  No pertinent medical history.  Patient is not currently prescribed any medications.  No current outpatient providers.  Upon admission, patient's BAL was negative and urine drug screen was positive for marijuana. Fernando Mccormick, Fernando [name], [name], Date of Service: 06/04/2024  4:17 PM ).  Patient assessment note: On assessment today, the pt reports that their mood is improved since admission, and continuing to show improvement on a day by day basis. Denies feeling down, depressed, or sad.  Reports that anxiety symptoms are at a manageable level currently.  Reports that Sleep is fair, but that he would benefit from having more sleep medication as he is continuing to have some difficulty falling asleep as well as staying asleep. States that he takes Trazodone  150 mg at home nightly and is wanting the same dosage here on the [facility]. Appetite is stable.  Concentration is without complaint.  Energy level is adequate. Denies having any suicidal thoughts. Denies having any suicidal intent and plan.  Denies having any HI.  Denies having psychotic symptoms.   Denies having side effects to current psychiatric medications. Patient remains on an Ativan  taper for alcohol use d/o which ends on 01/12, and he is projected to be transferred to [facility] on same date after his taper ends. He states that he does not want any other medications  other than Trazodone  and Concerta, but has been educated that we will not be giving him any Concerta here at the [facility] since symptoms are needing to be seen in the absence of stimulant type medications, to which he verbalizes understanding.   Discussed discharge planning for Monday, 1/12 to [facility]    PHQ 2-9:  Flowsheet Row ED from 06/02/2024 in [facility]  Thoughts that you would be better off dead, or of hurting yourself in some way Not at all  PHQ-9 Total Score 0    Flowsheet Row ED from 06/02/2024 in [facility] Most recent reading at 06/02/2024  6:50 PM ED from 06/02/2024 in [facility] Most recent reading at 06/02/2024  3:54 PM UC from 06/25/2023 in [facility] [place] Most recent reading at 06/25/2023  1:36 PM  C-SSRS RISK CATEGORY No Risk No Risk No Risk    Screenings    Flowsheet Row Most Recent Value  CIWA-Ar Total 0    Total Time spent with patient: 20 minutes  Musculoskeletal  Strength & Muscle Tone: within normal limits Gait & Station: normal Patient leans: N/A  Psychiatric Specialty Exam  Presentation General Appearance:  Appropriate for Environment; Well Groomed  Eye Contact: Fair  Speech: Clear and Coherent  Speech Volume: Normal  Handedness: Right   Mood and Affect  Mood: Depressed; Anxious  Affect: Congruent   Thought Process  Thought Processes: Coherent  Descriptions of Associations:Intact  Orientation:Full (Time, Place and Person)  Thought Content:WDL  Diagnosis of Schizophrenia or Schizoaffective disorder in past: No  Hallucinations:No data recorded  Ideas of Reference:None  Suicidal Thoughts:Suicidal Thoughts: No   Homicidal Thoughts:Homicidal Thoughts: No    Sensorium  Memory: Immediate Fair  Judgment: Fair  Insight: Fair   Art Therapist  Concentration: Fair  Attention  Span: Fair  Recall: Fair  Fund of Knowledge: Fair  Language: Fair   Psychomotor Activity  Psychomotor Activity: Psychomotor Activity: Normal    Assets  Assets: Resilience   Sleep  Sleep: Sleep: Fair    Nutritional Assessment (For OBS and FBC admissions only) Has the patient had a weight loss or gain of 10 pounds or more in the last 3 months?: No Has the patient had a decrease in food intake/or appetite?: No Does the patient have dental problems?: No Does the patient have eating habits or behaviors that may be indicators of an eating disorder including binging or inducing vomiting?: No Has the patient recently lost weight without trying?: 0 Has the patient been eating poorly because of a decreased appetite?: 0 Malnutrition Screening Tool Score: 0     Physical Exam Vitals and nursing note reviewed.  Constitutional:      Appearance: Normal appearance.  HENT:     Head: Normocephalic.     Nose: Nose normal.  Eyes:     Extraocular Movements: Extraocular movements intact.  Cardiovascular:     Rate and Rhythm: Normal rate.  Pulmonary:     Effort: Pulmonary effort is normal.  Musculoskeletal:        General: Normal range of motion.     Cervical back: Normal range of motion.  Neurological:     General: No focal deficit present.     Mental Status: He is alert and oriented to person, place, and time. Mental status is at baseline.  Psychiatric:        Mood and Affect: Mood normal.        Behavior: Behavior normal.        Thought Content: Thought content normal.        Judgment: Judgment normal.    Review of Systems  Constitutional: Negative.   HENT: Negative.    Eyes: Negative.   Respiratory: Negative.    Cardiovascular: Negative.   Gastrointestinal: Negative.   Genitourinary: Negative.   Musculoskeletal: Negative.   Neurological: Negative.   Endo/Heme/Allergies: Negative.   Psychiatric/Behavioral:  Positive for depression and substance abuse.  Negative for hallucinations, memory loss and suicidal ideas. The patient is nervous/anxious and has insomnia.   All other systems reviewed and are negative.  Blood pressure 103/68, pulse 74, temperature 98 F (36.7 C), temperature source Oral, resp. rate 18, SpO2 99%. There is no height or weight on file to calculate BMI.  Past Psychiatric History: alcohol use disorder, ADD and bipolar 1 disorder.  Patient was previously prescribed Concerta and Depakote about 1 year ago.  Patient denies inpatient psychiatric treatment.   Is the patient at risk to self? No  Has the patient been a risk to self in the past 6 months? No .    Has the patient been a risk to self within the distant past? No   Is the patient a risk to others? No   Has the patient been a risk to others in the past 6 months? No   Has the patient been a risk to others within the distant past? No   Past Medical History: None reported Family History:  Family History  Problem Relation Age of Onset   Stroke Father    Hypertension Other  Diabetes Other    Cancer Other     Social History: Patient was released from jail 20 months ago.  He is currently on probation through [facility].  He endorses alcohol and marijuana use.  Patient is currently unemployed and living with grandparents.  Last Labs:  Admission on 06/02/2024  Component Date Value Ref Range Status   WBC 06/02/2024 7.2  4.0 - 10.5 K/uL Final   RBC 06/02/2024 5.66  4.22 - 5.81 MIL/uL Final   Hemoglobin 06/02/2024 16.3  13.0 - 17.0 g/dL Final   HCT 98/92/7973 49.0  39.0 - 52.0 % Final   MCV 06/02/2024 86.6  80.0 - 100.0 fL Final   MCH 06/02/2024 28.8  26.0 - 34.0 pg Final   MCHC 06/02/2024 33.3  30.0 - 36.0 g/dL Final   RDW 98/92/7973 12.9  11.5 - 15.5 % Final   Platelets 06/02/2024 306  150 - 400 K/uL Final   nRBC 06/02/2024 0.0  0.0 - 0.2 % Final   Neutrophils Relative % 06/02/2024 44  % Final   Neutro Abs 06/02/2024 3.3  1.7 - 7.7 K/uL Final   Lymphocytes Relative  06/02/2024 43  % Final   Lymphs Abs 06/02/2024 3.1  0.7 - 4.0 K/uL Final   Monocytes Relative 06/02/2024 9  % Final   Monocytes Absolute 06/02/2024 0.6  0.1 - 1.0 K/uL Final   Eosinophils Relative 06/02/2024 2  % Final   Eosinophils Absolute 06/02/2024 0.1  0.0 - 0.5 K/uL Final   Basophils Relative 06/02/2024 1  % Final   Basophils Absolute 06/02/2024 0.1  0.0 - 0.1 K/uL Final   Immature Granulocytes 06/02/2024 1  % Final   Abs Immature Granulocytes 06/02/2024 0.04  0.00 - 0.07 K/uL Final   Performed at [facility], [address], [place], [place] [address]   Sodium 06/02/2024 141  135 - 145 mmol/L Final   Potassium 06/02/2024 4.2  3.5 - 5.1 mmol/L Final   Chloride 06/02/2024 101  98 - 111 mmol/L Final   CO2 06/02/2024 28  22 - 32 mmol/L Final   Glucose, Bld 06/02/2024 70  70 - 99 mg/dL Final   Glucose reference range applies only to samples taken after fasting for at least 8 hours.   BUN 06/02/2024 15  6 - 20 mg/dL Final   Creatinine, Ser 06/02/2024 1.11  0.61 - 1.24 mg/dL Final   Calcium 98/92/7973 9.5  8.9 - 10.3 mg/dL Final   Total Protein 98/92/7973 7.4  6.5 - 8.1 g/dL Final   Albumin 98/92/7973 4.7  3.5 - 5.0 g/dL Final   AST 98/92/7973 23  15 - 41 U/L Final   ALT 06/02/2024 37  0 - 44 U/L Final   Alkaline Phosphatase 06/02/2024 68  38 - 126 U/L Final   Total Bilirubin 06/02/2024 0.4  0.0 - 1.2 mg/dL Final   GFR, Estimated 06/02/2024 >60  >60 mL/min Final   Comment: (NOTE) Calculated using the CKD-EPI Creatinine Equation (2021)    Anion gap 06/02/2024 12  5 - 15 Final   Performed at [facility], [address], [place], [place] [address]   Alcohol, Ethyl (B) 06/02/2024 <15  <15 mg/dL Final   Comment: (NOTE) For medical purposes only. Performed at [facility], [address], [place], [place] [address]    TSH 06/02/2024 0.695  0.350 - 4.500 uIU/mL Final   Performed at [facility], [address], [place], [place] [address]  Admission on  06/02/2024, Discharged on 06/02/2024  Component Date Value Ref Range Status   POC Amphetamine UR 06/02/2024 None Detected  NONE DETECTED (Cut Off Level 1000 ng/mL) Final   POC Secobarbital (BAR) 06/02/2024 None Detected  NONE DETECTED (Cut Off Level 300 ng/mL) Final   POC Buprenorphine (BUP) 06/02/2024 None Detected  NONE DETECTED (Cut Off Level 10 ng/mL) Final   POC Oxazepam (BZO) 06/02/2024 None Detected  NONE DETECTED (Cut Off Level 300 ng/mL) Final   POC Cocaine UR 06/02/2024 None Detected  NONE DETECTED (Cut Off Level 300 ng/mL) Final   POC Methamphetamine UR 06/02/2024 None Detected  NONE DETECTED (Cut Off Level 1000 ng/mL) Final   POC Morphine  06/02/2024 None Detected  NONE DETECTED (Cut Off Level 300 ng/mL) Final   POC Methadone UR 06/02/2024 None Detected  NONE DETECTED (Cut Off Level 300 ng/mL) Final   POC Oxycodone  UR 06/02/2024 None Detected  NONE DETECTED (Cut Off Level 100 ng/mL) Final   POC Marijuana UR 06/02/2024 Positive (A)  NONE DETECTED (Cut Off Level 50 ng/mL) Final    Allergies: Patient has no known allergies.  Medications:  Facility Ordered Medications  Medication   [EXPIRED] hydrOXYzine  (ATARAX ) tablet 25 mg   [EXPIRED] loperamide  (IMODIUM ) capsule 2-4 mg   [EXPIRED] LORazepam  (ATIVAN ) tablet 1 mg   [COMPLETED] LORazepam  (ATIVAN ) tablet 1 mg   Followed by   [COMPLETED] LORazepam  (ATIVAN ) tablet 1 mg   Followed by   LORazepam  (ATIVAN ) tablet 1 mg   Followed by   NOREEN ON 06/07/2024] LORazepam  (ATIVAN ) tablet 1 mg   multivitamin with minerals tablet 1 tablet   [EXPIRED] ondansetron  (ZOFRAN -ODT) disintegrating tablet 4 mg   thiamine  (VITAMIN B1) injection 100 mg   thiamine  (VITAMIN B1) tablet 100 mg   haloperidol  (HALDOL ) tablet 5 mg   And   diphenhydrAMINE  (BENADRYL ) capsule 50 mg   haloperidol  lactate (HALDOL ) injection 5 mg   And   diphenhydrAMINE  (BENADRYL ) injection 50 mg   And   LORazepam  (ATIVAN ) injection 2 mg   haloperidol  lactate (HALDOL )  injection 10 mg   And   diphenhydrAMINE  (BENADRYL ) injection 50 mg   And   LORazepam  (ATIVAN ) injection 2 mg   acetaminophen  (TYLENOL ) tablet 650 mg   alum & mag hydroxide-simeth (MAALOX/MYLANTA) 200-200-20 MG/5ML suspension 30 mL   magnesium  hydroxide (MILK OF MAGNESIA) suspension 30 mL   traZODone  (DESYREL ) tablet 150 mg   PTA Medications  Medication Sig   traZODone  (DESYREL ) 150 MG tablet Take 150 mg by mouth at bedtime.   albuterol (VENTOLIN HFA) 108 (90 Base) MCG/ACT inhaler Inhale 2 puffs into the lungs every 6 (six) hours as needed for wheezing or shortness of breath.   methylphenidate 27 MG PO CR tablet Take 27 mg by mouth daily.   Treatment Plan Summary: Daily contact with patient to assess and evaluate symptoms and progress in treatment and Medication management  Safety and Monitoring: Voluntary admission to inpatient psychiatric unit for safety, stabilization and treatment Daily contact with patient to assess and evaluate symptoms and progress in treatment Patient's case to be discussed in multi-disciplinary team meeting Observation Level : q15 minute checks Vital signs: q12 hours Precautions: Safety  Long Term Goal(s): Improvement in symptoms so as ready for discharge  Short Term Goals: Ability to identify changes in lifestyle to reduce recurrence of condition will improve, Ability to verbalize feelings will improve, Ability to disclose and discuss suicidal ideas, Ability to demonstrate self-control will improve, Ability to identify and develop effective coping behaviors will improve,  Ability to maintain clinical measurements within normal limits will improve, Compliance with prescribed medications will improve, and Ability to identify triggers associated with substance abuse/mental health issues will improve  Diagnoses Principal Problem:   Alcohol use disorder, severe, dependence (HCC) Active Problems:   Alcohol intoxication  Medications:Adjustments this shift:  Increased trazodone  to home dose of 150 mg nightly due to complaints of insomnia.  LORazepam   1 mg Oral BID   Followed by   NOREEN ON 06/07/2024] LORazepam   1 mg Oral Daily   multivitamin with minerals  1 tablet Oral Daily   thiamine   100 mg Intramuscular Once   thiamine   100 mg Oral Daily    PRNS -Continue Tylenol  650 mg every 6 hours PRN for mild pain -Continue Maalox 30 mg every 4 hrs PRN for indigestion -Continue Milk of Magnesia as needed every 6 hrs for constipation  Discharge Planning: Social work and case management to assist with discharge planning and identification of hospital follow-up needs prior to discharge Estimated LOS: 5-7 days Discharge Concerns: Need to establish a safety plan; Medication compliance and effectiveness Discharge Goals: Return home with outpatient referrals for mental health follow-up including medication management/psychotherapy  I certify that inpatient services furnished can reasonably be expected to improve the patient's condition.   Total Time Spent in Direct Patient Care:  I personally spent 50 minutes on the unit in direct patient care. The direct patient care time included face-to-face time with the patient, reviewing the patient's chart, communicating with other professionals, and coordinating care. Greater than 50% of this time was spent in counseling or coordinating care with the patient regarding goals of hospitalization, psycho-education, and discharge planning needs.   Donia Snell, NP 1/10/20265:59 PM

## 2024-06-05 NOTE — ED Notes (Signed)
 Patient A&Ox4. Denies intent to harm self/others when asked. Denies A/VH. Patient denies any physical complaints when asked. No acute distress noted. Support and encouragement provided. Routine safety checks conducted according to facility protocol. Encouraged patient to notify staff if thoughts of harm toward self or others arise. Patient verbalize understanding and agreement. Will continue to monitor for safety.

## 2024-06-05 NOTE — ED Notes (Signed)
 Pt sleeping long intervals this shift.  In no apparent distress.  Safety maintained,

## 2024-06-05 NOTE — Group Note (Signed)
 Group Topic: Understanding Self  Group Date: 06/05/2024 Start Time: 1000 End Time: 1100 Facilitators: Alyse Leilani LABOR, NT  Department: [facility]  Number of Participants: 9  Group Focus: acceptance, activities of daily living skills, and affirmation Treatment Modality:  Psychoeducation and Skills Training Interventions utilized were clarification, confrontation, and mental fitness Purpose: enhance coping skills, improve communication skills, regain self-worth, and reinforce self-care  Name: Demarko Zeimet Date of Birth: [birth year]  MR: [id number]   Pt did come to group, he wants to sleep  Patients Problems:  Patient Active Problem List   Diagnosis Date Noted   Alcohol intoxication 06/03/2024   Alcohol use disorder, severe, dependence (HCC) 06/02/2024

## 2024-06-05 NOTE — ED Notes (Signed)
 Pt currently on telephone. No acute distress noted. No concerns voiced. Informed pt to notify staff with any needs or assistance. Pt verbalized understanding and agreement. Will continue to monitor for safety.

## 2024-06-05 NOTE — ED Notes (Signed)
 Pt sleeping long periods this shift in no apparent distress.

## 2024-06-05 NOTE — Group Note (Unsigned)
 Group Topic: Understanding Self  Group Date: 06/05/2024 Start Time: 1000 End Time: 1100 Facilitators: Alyse Leilani LABOR, NT  Department: [facility]  Number of Participants: 8  Group Focus: chemical dependency issues, clarity of thought, and co-dependency Treatment Modality:  Psychoeducation Interventions utilized were clarification, confrontation, and group exercise Purpose: enhance coping skills and reinforce self-care   Name: Dashiel Bergquist Date of Birth: 1990-08-17  MR: [id number]    Level of Participation: {THERAPIES; PSYCH GROUP PARTICIPATION OZCZO:76008} Quality of Participation: {THERAPIES; PSYCH QUALITY OF PARTICIPATION:23992} Interactions with others: {THERAPIES; PSYCH INTERACTIONS:23993} Mood/Affect: {THERAPIES; PSYCH MOOD/AFFECT:23994} Triggers (if applicable): *** Cognition: {THERAPIES; PSYCH COGNITION:23995} Progress: {THERAPIES; PSYCH PROGRESS:23997} Response: *** Plan: {THERAPIES; PSYCH EOJW:76003}  Patients Problems:  Patient Active Problem List   Diagnosis Date Noted   Alcohol intoxication 06/03/2024   Alcohol use disorder, severe, dependence (HCC) 06/02/2024

## 2024-06-05 NOTE — Group Note (Signed)
 Group Topic: Emotional Regulation  Group Date: 06/05/2024 Start Time: 8069 End Time: 2000 Facilitators: Verdon Jacqualyn BRAVO, NT  Department: [facility]  Number of Participants: 8  Group Focus: affirmation Treatment Modality:  Eclectic Therapy Interventions utilized were group exercise Purpose: express feelings  Name: Parry Po Date of Birth: 07/06/1990  MR: [id number]    Level of Participation:did not participate Quality of Participation: none Interactions with others: n/a Mood/Affect: n/a Triggers (if applicable): n/a Cognition: n/a Progress: Other Response: n/a Plan: patient will be encouraged to attend group  Patients Problems:  Patient Active Problem List   Diagnosis Date Noted   Alcohol intoxication 06/03/2024   Alcohol use disorder, severe, dependence (HCC) 06/02/2024

## 2024-06-06 DIAGNOSIS — F1290 Cannabis use, unspecified, uncomplicated: Secondary | ICD-10-CM | POA: Diagnosis not present

## 2024-06-06 DIAGNOSIS — F1020 Alcohol dependence, uncomplicated: Secondary | ICD-10-CM | POA: Diagnosis not present

## 2024-06-06 DIAGNOSIS — F319 Bipolar disorder, unspecified: Secondary | ICD-10-CM | POA: Diagnosis not present

## 2024-06-06 DIAGNOSIS — F988 Other specified behavioral and emotional disorders with onset usually occurring in childhood and adolescence: Secondary | ICD-10-CM | POA: Diagnosis not present

## 2024-06-06 NOTE — ED Provider Notes (Signed)
 Facility Based Crisis Progress Note  Date: 06/06/2024 Patient Name: Fernando Mccormick MRN: [id number] Chief Complaint: Alcohol detox  Diagnoses:  Final diagnoses:  Substance induced mood disorder (HCC)  Depression, unspecified depression type  Alcohol use disorder  Marijuana use    HPI: Fernando Mccormick, 34 y.o., male admitted to [facility] from [facility] voluntarily for alcohol detox. Patient seen face to face by this provider, and chart reviewed on 06/06/2024.  Per chart review patient has a past psychiatric history of alcohol use disorder, ADD and bipolar 1 disorder.  No pertinent medical history.  Patient is not currently prescribed any medications.  No current outpatient providers.  Upon admission, patient's BAL was negative and urine drug screen was positive for marijuana. [name], [name], [name], Date of Service: 06/04/2024  4:17 PM ).  Daily Notes, 06/06/2024: On assessment today, the pt reports that their mood is euthymic, improved since admission, and stable. Denies feeling down, depressed, or sad.  Reports that anxiety symptoms are at manageable level.  Sleep is stable. Appetite is stable.  Concentration is without complaint.  Energy level is adequate. Denies having any suicidal thoughts. Denies having any suicidal intent and plan.  Denies having any HI.  Denies having psychotic symptoms.   Denies having side effects to current psychiatric medications.  Discussed discharge planning for Monday, 1/12 to [facility]    PHQ 2-9:  Flowsheet Row ED from 06/02/2024 in [facility]  Thoughts that you would be better off dead, or of hurting yourself in some way Not at all  PHQ-9 Total Score 0    Flowsheet Row ED from 06/02/2024 in [facility] Most recent reading at 06/02/2024  6:50 PM ED from 06/02/2024 in [facility] Most recent reading at 06/02/2024  3:54 PM UC from 06/25/2023 in [facility] [place] Most recent reading at 06/25/2023  1:36 PM  C-SSRS RISK CATEGORY No Risk No Risk No Risk    Screenings    Flowsheet Row Most Recent Value  CIWA-Ar Total 0    Total Time spent with patient: 20 minutes  Musculoskeletal  Strength & Muscle Tone: within normal limits Gait & Station: normal Patient leans: N/A  Psychiatric Specialty Exam  Presentation General Appearance:  Casual  Eye Contact: Fair  Speech: Clear and Coherent  Speech Volume: Normal  Handedness: Right   Mood and Affect  Mood: Euthymic  Affect: Congruent   Thought Process  Thought Processes: Coherent  Descriptions of Associations:Intact  Orientation:Full (Time, Place and Person)  Thought Content:Logical  Diagnosis of Schizophrenia or Schizoaffective disorder in past: No   Hallucinations:Hallucinations: None   Ideas of Reference:None  Suicidal Thoughts:Suicidal Thoughts: No   Homicidal Thoughts:Homicidal Thoughts: No    Sensorium  Memory: Immediate Good  Judgment: Fair  Insight: Good   Executive Functions  Concentration: Good  Attention Span: Good  Recall: Good  Fund of Knowledge: Good  Language: Good   Psychomotor Activity  Psychomotor Activity: Psychomotor Activity: Normal    Assets  Assets: Communication Skills   Sleep  Sleep: Sleep: Good    Nutritional Assessment (For OBS and FBC admissions only) Has the patient had a weight loss or gain of 10 pounds or more in the last 3 months?: No Has the patient had a decrease in food intake/or appetite?: No Does the patient have dental problems?: No Does the patient have eating habits or behaviors that may be indicators of an eating disorder including  binging or inducing vomiting?: No Has the patient recently lost weight without trying?: 0 Has the patient been eating poorly because of a decreased appetite?: 0 Malnutrition Screening Tool Score: 0     Physical  Exam Vitals and nursing note reviewed.  Constitutional:      Appearance: Normal appearance.  HENT:     Head: Normocephalic.     Nose: Nose normal.  Eyes:     Extraocular Movements: Extraocular movements intact.  Cardiovascular:     Rate and Rhythm: Normal rate.  Pulmonary:     Effort: Pulmonary effort is normal.  Musculoskeletal:        General: Normal range of motion.     Cervical back: Normal range of motion.  Neurological:     General: No focal deficit present.     Mental Status: He is alert and oriented to person, place, and time. Mental status is at baseline.  Psychiatric:        Mood and Affect: Mood normal.        Behavior: Behavior normal.        Thought Content: Thought content normal.        Judgment: Judgment normal.    Review of Systems  Constitutional: Negative.   HENT: Negative.    Eyes: Negative.   Respiratory: Negative.    Cardiovascular: Negative.   Gastrointestinal: Negative.   Genitourinary: Negative.   Musculoskeletal: Negative.   Neurological: Negative.   Endo/Heme/Allergies: Negative.   Psychiatric/Behavioral:  Positive for depression and substance abuse. Negative for hallucinations, memory loss and suicidal ideas. The patient is nervous/anxious and has insomnia.   All other systems reviewed and are negative.  Blood pressure 115/66, pulse 64, temperature 97.7 F (36.5 C), temperature source Oral, resp. rate 18, SpO2 97%. There is no height or weight on file to calculate BMI.  Past Psychiatric History: alcohol use disorder, ADD and bipolar 1 disorder.  Patient was previously prescribed Concerta and Depakote about 1 year ago.  Patient denies inpatient psychiatric treatment.   Is the patient at risk to self? No  Has the patient been a risk to self in the past 6 months? No .    Has the patient been a risk to self within the distant past? No   Is the patient a risk to others? No   Has the patient been a risk to others in the past 6 months? No   Has  the patient been a risk to others within the distant past? No   Past Medical History: None reported Family History:  Family History  Problem Relation Age of Onset   Stroke Father    Hypertension Other    Diabetes Other    Cancer Other     Social History: Patient was released from jail 20 months ago.  He is currently on probation through [facility].  He endorses alcohol and marijuana use.  Patient is currently unemployed and living with grandparents.  Last Labs:  Admission on 06/02/2024  Component Date Value Ref Range Status   WBC 06/02/2024 7.2  4.0 - 10.5 K/uL Final   RBC 06/02/2024 5.66  4.22 - 5.81 MIL/uL Final   Hemoglobin 06/02/2024 16.3  13.0 - 17.0 g/dL Final   HCT 98/92/7973 49.0  39.0 - 52.0 % Final   MCV 06/02/2024 86.6  80.0 - 100.0 fL Final   MCH 06/02/2024 28.8  26.0 - 34.0 pg Final   MCHC 06/02/2024 33.3  30.0 - 36.0 g/dL Final   RDW 98/92/7973 12.9  11.5 - 15.5 % Final   Platelets 06/02/2024 306  150 - 400 K/uL Final   nRBC 06/02/2024 0.0  0.0 - 0.2 % Final   Neutrophils Relative % 06/02/2024 44  % Final   Neutro Abs 06/02/2024 3.3  1.7 - 7.7 K/uL Final   Lymphocytes Relative 06/02/2024 43  % Final   Lymphs Abs 06/02/2024 3.1  0.7 - 4.0 K/uL Final   Monocytes Relative 06/02/2024 9  % Final   Monocytes Absolute 06/02/2024 0.6  0.1 - 1.0 K/uL Final   Eosinophils Relative 06/02/2024 2  % Final   Eosinophils Absolute 06/02/2024 0.1  0.0 - 0.5 K/uL Final   Basophils Relative 06/02/2024 1  % Final   Basophils Absolute 06/02/2024 0.1  0.0 - 0.1 K/uL Final   Immature Granulocytes 06/02/2024 1  % Final   Abs Immature Granulocytes 06/02/2024 0.04  0.00 - 0.07 K/uL Final   Performed at [facility], [address] [place], [place] [address]   Sodium 06/02/2024 141  135 - 145 mmol/L Final   Potassium 06/02/2024 4.2  3.5 - 5.1 mmol/L Final   Chloride 06/02/2024 101  98 - 111 mmol/L Final   CO2 06/02/2024 28  22 - 32 mmol/L Final   Glucose, Bld 06/02/2024 70  70 - 99  mg/dL Final   Glucose reference range applies only to samples taken after fasting for at least 8 hours.   BUN 06/02/2024 15  6 - 20 mg/dL Final   Creatinine, Ser 06/02/2024 1.11  0.61 - 1.24 mg/dL Final   Calcium 98/92/7973 9.5  8.9 - 10.3 mg/dL Final   Total Protein 98/92/7973 7.4  6.5 - 8.1 g/dL Final   Albumin 98/92/7973 4.7  3.5 - 5.0 g/dL Final   AST 98/92/7973 23  15 - 41 U/L Final   ALT 06/02/2024 37  0 - 44 U/L Final   Alkaline Phosphatase 06/02/2024 68  38 - 126 U/L Final   Total Bilirubin 06/02/2024 0.4  0.0 - 1.2 mg/dL Final   GFR, Estimated 06/02/2024 >60  >60 mL/min Final   Comment: (NOTE) Calculated using the CKD-EPI Creatinine Equation (2021)    Anion gap 06/02/2024 12  5 - 15 Final   Performed at [facility], [address], [place], [place] [address]   Alcohol, Ethyl (B) 06/02/2024 <15  <15 mg/dL Final   Comment: (NOTE) For medical purposes only. Performed at [facility], [address] [place], [place] [address]    TSH 06/02/2024 0.695  0.350 - 4.500 uIU/mL Final   Performed at [facility], [address] [place], [place] [address]  Admission on 06/02/2024, Discharged on 06/02/2024  Component Date Value Ref Range Status   POC Amphetamine UR 06/02/2024 None Detected  NONE DETECTED (Cut Off Level 1000 ng/mL) Final   POC Secobarbital (BAR) 06/02/2024 None Detected  NONE DETECTED (Cut Off Level 300 ng/mL) Final   POC Buprenorphine (BUP) 06/02/2024 None Detected  NONE DETECTED (Cut Off Level 10 ng/mL) Final   POC Oxazepam (BZO) 06/02/2024 None Detected  NONE DETECTED (Cut Off Level 300 ng/mL) Final   POC Cocaine UR 06/02/2024 None Detected  NONE DETECTED (Cut Off Level 300 ng/mL) Final   POC Methamphetamine UR 06/02/2024 None Detected  NONE DETECTED (Cut Off Level 1000 ng/mL) Final   POC Morphine  06/02/2024 None Detected  NONE DETECTED (Cut Off Level 300 ng/mL) Final   POC Methadone UR 06/02/2024 None Detected  NONE DETECTED (Cut Off Level  300  ng/mL) Final   POC Oxycodone  UR 06/02/2024 None Detected  NONE DETECTED (Cut Off Level 100 ng/mL) Final   POC Marijuana UR 06/02/2024 Positive (A)  NONE DETECTED (Cut Off Level 50 ng/mL) Final   Allergies: Patient has no known allergies.  Medications:  Facility Ordered Medications  Medication   [EXPIRED] hydrOXYzine  (ATARAX ) tablet 25 mg   [EXPIRED] loperamide  (IMODIUM ) capsule 2-4 mg   [EXPIRED] LORazepam  (ATIVAN ) tablet 1 mg   [COMPLETED] LORazepam  (ATIVAN ) tablet 1 mg   Followed by   [COMPLETED] LORazepam  (ATIVAN ) tablet 1 mg   Followed by   [COMPLETED] LORazepam  (ATIVAN ) tablet 1 mg   Followed by   NOREEN ON 06/07/2024] LORazepam  (ATIVAN ) tablet 1 mg   multivitamin with minerals tablet 1 tablet   [EXPIRED] ondansetron  (ZOFRAN -ODT) disintegrating tablet 4 mg   thiamine  (VITAMIN B1) injection 100 mg   thiamine  (VITAMIN B1) tablet 100 mg   haloperidol  (HALDOL ) tablet 5 mg   And   diphenhydrAMINE  (BENADRYL ) capsule 50 mg   haloperidol  lactate (HALDOL ) injection 5 mg   And   diphenhydrAMINE  (BENADRYL ) injection 50 mg   And   LORazepam  (ATIVAN ) injection 2 mg   haloperidol  lactate (HALDOL ) injection 10 mg   And   diphenhydrAMINE  (BENADRYL ) injection 50 mg   And   LORazepam  (ATIVAN ) injection 2 mg   acetaminophen  (TYLENOL ) tablet 650 mg   alum & mag hydroxide-simeth (MAALOX/MYLANTA) 200-200-20 MG/5ML suspension 30 mL   magnesium  hydroxide (MILK OF MAGNESIA) suspension 30 mL   traZODone  (DESYREL ) tablet 150 mg   PTA Medications  Medication Sig   traZODone  (DESYREL ) 150 MG tablet Take 150 mg by mouth at bedtime.   albuterol (VENTOLIN HFA) 108 (90 Base) MCG/ACT inhaler Inhale 2 puffs into the lungs every 6 (six) hours as needed for wheezing or shortness of breath.   methylphenidate 27 MG PO CR tablet Take 27 mg by mouth daily.   Treatment Plan Summary: Daily contact with patient to assess and evaluate symptoms and progress in treatment and Medication management  Safety and  Monitoring: Voluntary admission to inpatient psychiatric unit for safety, stabilization and treatment Daily contact with patient to assess and evaluate symptoms and progress in treatment Patient's case to be discussed in multi-disciplinary team meeting Observation Level : q15 minute checks Vital signs: q12 hours Precautions: Safety  Long Term Goal(s): Improvement in symptoms so as ready for discharge  Short Term Goals: Ability to identify changes in lifestyle to reduce recurrence of condition will improve, Ability to verbalize feelings will improve, Ability to disclose and discuss suicidal ideas, Ability to demonstrate self-control will improve, Ability to identify and develop effective coping behaviors will improve, Ability to maintain clinical measurements within normal limits will improve, Compliance with prescribed medications will improve, and Ability to identify triggers associated with substance abuse/mental health issues will improve  Diagnoses Principal Problem:   Alcohol use disorder, severe, dependence (HCC) Active Problems:   Alcohol intoxication  Medications:Adjustments this shift: Increased trazodone  to home dose of 150 mg nightly due to complaints of insomnia.  [START ON 06/07/2024] LORazepam   1 mg Oral Daily   multivitamin with minerals  1 tablet Oral Daily   thiamine   100 mg Intramuscular Once   thiamine   100 mg Oral Daily    PRNS -Continue Tylenol  650 mg every 6 hours PRN for mild pain -Continue Maalox 30 mg every 4 hrs PRN for indigestion -Continue Milk of Magnesia as needed every 6 hrs for constipation  Discharge Planning: Social work  and case management to assist with discharge planning and identification of hospital follow-up needs prior to discharge Estimated LOS: 3-5 days Discharge Concens: Need to establish a safety plan; Medication compliance and effectiveness Discharge Goals: Return home with outpatient referrals for mental health follow-up including  medication management/psychotherapy  I certify that inpatient services furnished can reasonably be expected to improve the patient's condition.   Total Time Spent in Direct Patient Care:  I personally spent 50 minutes on the unit in direct patient care. The direct patient care time included face-to-face time with the patient, reviewing the patient's chart, communicating with other professionals, and coordinating care. Greater than 50% of this time was spent in counseling or coordinating care with the patient regarding goals of hospitalization, psycho-education, and discharge planning needs.   Donia Snell, NP 1/11/20268:13 PM

## 2024-06-06 NOTE — ED Notes (Signed)
 Pt presents with flat affect.  Excited about scheduled discharge able to discuss reasonable goals.  Completed safety plan. Safety maintained.

## 2024-06-06 NOTE — ED Notes (Signed)
 Pt sitting in dayroom watching television and interacting with peers. No acute distress noted. No concerns voiced. Informed pt to notify staff with any needs or assistance. Pt verbalized understanding and agreement. Will continue to monitor for safety.

## 2024-06-06 NOTE — ED Notes (Signed)
Patient resting comfortably in bed with no s/s of distress.

## 2024-06-06 NOTE — ED Notes (Signed)
 Patient A&Ox4. Denies intent to harm self/others when asked. Denies A/VH. Patient denies any physical complaints when asked. No acute distress noted. Support and encouragement provided. Routine safety checks conducted according to facility protocol. Encouraged patient to notify staff if thoughts of harm toward self or others arise. Patient verbalize understanding and agreement. Will continue to monitor for safety.

## 2024-06-06 NOTE — Group Note (Signed)
 Group Topic: Overcoming Obstacles  Group Date: 06/06/2024 Start Time: 2000 End Time: 2100 Facilitators: Luvenia Mae SAUNDERS, NT  Department: [facility]  Number of Participants: 4  Group Focus: other Goal Setting Treatment Modality:  Leisure Development Interventions utilized were assignment Purpose: regain self-worth  Name: Fernando Mccormick Date of Birth: [birth year]  MR: [id number]    Level of Participation: active Quality of Participation: attentive and cooperative Interactions with others: gave feedback Mood/Affect: appropriate Triggers (if applicable): none Cognition: coherent/clear Progress: Gaining insight Response: none Plan: patient will be encouraged to continue with group  Patients Problems:  Patient Active Problem List   Diagnosis Date Noted   Alcohol intoxication 06/03/2024   Alcohol use disorder, severe, dependence (HCC) 06/02/2024

## 2024-06-06 NOTE — Group Note (Signed)
 Group Topic: Communication  Group Date: 06/06/2024 Start Time: 0900 End Time: 1000 Facilitators: Herold Lajuana NOVAK, RN  Department: [facility]  Number of Participants: 6  Group Focus: communication Treatment Modality:  Leisure Development Interventions utilized were leisure development Purpose: increase insight  Name: Fernando Mccormick Date of Birth: 1990-06-04  MR: [id number]     Level of Participation: active Quality of Participation: cooperative Interactions with others: gave feedback Mood/Affect: appropriate Triggers (if applicable): none identified Cognition: coherent/clear Progress: Gaining insight Response: pt verbalized understanding of all medication given Plan: patient will be encouraged to remain medication compliant Patients Problems:  Patient Active Problem List   Diagnosis Date Noted   Alcohol intoxication 06/03/2024   Alcohol use disorder, severe, dependence (HCC) 06/02/2024

## 2024-06-06 NOTE — ED Notes (Signed)
 Pt observed sitting in the community room, watching TV  with peers. Offering good eye contact.  Affect remains flat.  Safety maintained.

## 2024-06-06 NOTE — ED Notes (Signed)
 RN spoke with patient A&Ox4 in commons area. Patient very social and at times loud.He denies intent to harm self/others when asked. Denies A/VH or any physical complaints when asked. No acute distress noted. Active listening, support and encouragement provided. Routine safety checks conducted according to facility protocol. Encouraged patient to notify staff if thoughts of harm toward self or others arise. Patient verbalize understanding and agreement.

## 2024-06-06 NOTE — Group Note (Signed)
 Group Topic: Understanding Self  Group Date: 06/06/2024 Start Time: 0100 End Time: 0125 Facilitators: Lonzell Dwayne [name], NT  Department: [facility]  Number of Participants: 2  Group Focus: acceptance Treatment Modality:  Psychoeducation Interventions utilized were exploration Purpose: reinforce self-care  Name: Fernando Mccormick Date of Birth: 04/10/1991  MR: [id number]    Level of Participation: Patient did not attend group Quality of Participation: N/A Interactions with others: N/A Mood/Affect: N/A Triggers (if applicable): N/A Cognition: N/A Progress: N/A Response: N/A Plan: N/A  Patients Problems:  Patient Active Problem List   Diagnosis Date Noted   Alcohol intoxication 06/03/2024   Alcohol use disorder, severe, dependence (HCC) 06/02/2024

## 2024-06-07 DIAGNOSIS — F1290 Cannabis use, unspecified, uncomplicated: Secondary | ICD-10-CM | POA: Diagnosis not present

## 2024-06-07 DIAGNOSIS — F988 Other specified behavioral and emotional disorders with onset usually occurring in childhood and adolescence: Secondary | ICD-10-CM | POA: Diagnosis not present

## 2024-06-07 DIAGNOSIS — F1020 Alcohol dependence, uncomplicated: Secondary | ICD-10-CM | POA: Diagnosis not present

## 2024-06-07 DIAGNOSIS — F1090 Alcohol use, unspecified, uncomplicated: Secondary | ICD-10-CM

## 2024-06-07 DIAGNOSIS — F319 Bipolar disorder, unspecified: Secondary | ICD-10-CM | POA: Diagnosis not present

## 2024-06-07 DIAGNOSIS — F32A Depression, unspecified: Secondary | ICD-10-CM

## 2024-06-07 DIAGNOSIS — F1994 Other psychoactive substance use, unspecified with psychoactive substance-induced mood disorder: Secondary | ICD-10-CM

## 2024-06-07 LAB — GC/CHLAMYDIA PROBE AMP (~~LOC~~) NOT AT ARMC
Chlamydia: NEGATIVE
Comment: NEGATIVE
Comment: NORMAL
Neisseria Gonorrhea: NEGATIVE

## 2024-06-07 LAB — SYPHILIS: RPR W/REFLEX TO RPR TITER AND TREPONEMAL ANTIBODIES, TRADITIONAL SCREENING AND DIAGNOSIS ALGORITHM: RPR Ser Ql: NONREACTIVE

## 2024-06-07 LAB — HIV ANTIBODY (ROUTINE TESTING W REFLEX): HIV Screen 4th Generation wRfx: NONREACTIVE

## 2024-06-07 MED ORDER — TRAZODONE HCL 150 MG PO TABS: 150.0000 mg | ORAL_TABLET | Freq: Every evening | ORAL | 0 refills | Status: AC | PRN

## 2024-06-07 NOTE — ED Notes (Signed)
 Pt awakened for VS and to prepare for d/c at this time to [facility].  Pt is with out complaints.  Escorted to storage room to change clothes and return valuables.  Pt discharged per safe ride at this time.

## 2024-06-07 NOTE — ED Notes (Signed)
 Pt scheduled to transport to [facility], transportation  Safe Transport services notified.
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
